# Patient Record
Sex: Male | Born: 1950 | Race: White | Hispanic: No | Marital: Married | Smoking: Never smoker
Health system: Southern US, Community
[De-identification: ages and names within clinical notes are randomized; demographics above are authoritative.]

## PROBLEM LIST (undated history)

## (undated) DIAGNOSIS — Z8619 Personal history of other infectious and parasitic diseases: Secondary | ICD-10-CM

## (undated) DIAGNOSIS — Z86010 Personal history of colonic polyps: Secondary | ICD-10-CM

## (undated) HISTORY — PX: COLONOSCOPY: SHX174

## (undated) HISTORY — DX: Personal history of other infectious and parasitic diseases: Z86.19

## (undated) HISTORY — DX: Personal history of colonic polyps: Z86.010

---

## 1972-10-27 HISTORY — PX: APPENDECTOMY: SHX54

## 2003-02-16 DIAGNOSIS — Z86010 Personal history of colonic polyps: Secondary | ICD-10-CM

## 2003-02-16 DIAGNOSIS — Z860101 Personal history of adenomatous and serrated colon polyps: Secondary | ICD-10-CM

## 2003-02-16 HISTORY — DX: Personal history of adenomatous and serrated colon polyps: Z86.0101

## 2003-02-16 HISTORY — DX: Personal history of colonic polyps: Z86.010

## 2003-02-16 LAB — HM COLONOSCOPY

## 2013-12-19 LAB — LIPID PANEL
Cholesterol: 190 mg/dL (ref 0–200)
HDL: 63 mg/dL (ref 35–70)
LDL Cholesterol: 113 mg/dL
Triglycerides: 69 mg/dL (ref 40–160)

## 2013-12-19 LAB — BASIC METABOLIC PANEL
BUN: 12 mg/dL (ref 4–21)
CREATININE: 0.8 mg/dL (ref 0.6–1.3)
Glucose: 84 mg/dL
Potassium: 4.7 mmol/L (ref 3.4–5.3)
Sodium: 142 mmol/L (ref 137–147)

## 2013-12-19 LAB — HEPATIC FUNCTION PANEL
ALT: 18 U/L (ref 10–40)
AST: 18 U/L (ref 14–40)

## 2013-12-19 LAB — PSA: PSA: 3.1

## 2015-01-08 LAB — PSA: PSA: 3.1

## 2015-01-08 LAB — LIPID PANEL
CHOLESTEROL: 170 mg/dL (ref 0–200)
HDL: 63 mg/dL (ref 35–70)
LDL CALC: 96 mg/dL
TRIGLYCERIDES: 57 mg/dL (ref 40–160)

## 2015-01-08 LAB — BASIC METABOLIC PANEL
BUN: 10 mg/dL (ref 4–21)
CREATININE: 0.7 mg/dL (ref 0.6–1.3)
GLUCOSE: 85 mg/dL
POTASSIUM: 4.6 mmol/L (ref 3.4–5.3)
Sodium: 140 mmol/L (ref 137–147)

## 2015-01-08 LAB — HEPATIC FUNCTION PANEL
ALT: 16 U/L (ref 10–40)
AST: 17 U/L (ref 14–40)

## 2015-05-08 ENCOUNTER — Encounter: Payer: Self-pay | Admitting: *Deleted

## 2015-05-08 ENCOUNTER — Ambulatory Visit (INDEPENDENT_AMBULATORY_CARE_PROVIDER_SITE_OTHER): Payer: Self-pay | Admitting: Family Medicine

## 2015-05-08 ENCOUNTER — Encounter: Payer: Self-pay | Admitting: Family Medicine

## 2015-05-08 VITALS — BP 114/80 | HR 71 | Temp 97.8°F | Resp 16 | Ht 72.5 in | Wt 203.0 lb

## 2015-05-08 DIAGNOSIS — K4090 Unilateral inguinal hernia, without obstruction or gangrene, not specified as recurrent: Secondary | ICD-10-CM | POA: Insufficient documentation

## 2015-05-08 DIAGNOSIS — Z86010 Personal history of colonic polyps: Secondary | ICD-10-CM

## 2015-05-08 NOTE — Progress Notes (Signed)
       Patient: Norman Wilson Male    DO: 02/28/1951   64 y.o.   MRM: [id number] Visit Date: 05/08/2015  Today's Provider: Mila Merryonald Uliana Brinker, MD   Chief Complaint  Patient presents with  . Groin Pain   Subjective:    Groin Pain The patient's pertinent negatives include no pelvic pain. This is a recurrent problem. The problem occurs intermittently. The problem has been waxing and waning. The pain is severe. Associated symptoms include joint pain. Pertinent negatives include no abdominal pain, chest pain, flank pain or headaches. The symptoms are aggravated by heavy lifting and activity. He has tried OTC analgesics for the symptoms. The treatment provided no relief.   He was initially seen for this in October, 2015, about 2 weeks after he was working and felt a pop in groin area when twisting and lifting heavy boxes. At that time exam did not reveal any apparent hernia, and it was though that he strain a torn a muscle in the area. However, he states that seen that pain has become increasing frequent, associated with lifting and being on his feet for long periods of time, and has been having significant swelling in left inguinal area, when he is able to push back in. After pushing swollen area, he states it will usually come back out after a few hours of being on his feet. He not any specific new injuries since he was initially evaluated in October.      No Known Allergies Previous Medications   ASPIRIN 81 MG TABLET    Take 81 mg by mouth daily.   NAPROXEN SODIUM (ANAPROX) 220 MG TABLET    Take 220 mg by mouth 2 (two) times daily with a meal.    Review of Systems  Cardiovascular: Negative for chest pain.  Gastrointestinal: Negative for abdominal pain.  Genitourinary: Negative for flank pain and pelvic pain.  Musculoskeletal: Positive for joint pain.  Neurological: Negative for headaches.    History  Substance Use Topics  . Smoking status: Never Smoker   . Smokeless tobacco: Not on  file  . Alcohol Use: No   Objective:   BP 114/80 mmHg  Pulse 71  Temp(Src) 97.8 F (36.6 C) (Oral)  Resp 16  Ht 6' 0.5" (1.842 m)  Wt 203 lb (92.08 kg)  BMI 27.14 kg/m2  SpO2 96%  Physical Exam  General Appearance:    Alert, cooperative, no distress  Eyes:    PERRL, conjunctiva/corneas clear, EOM's intact       Lungs:     Clear to auscultation bilaterally, respirations unlabored  Heart:    Regular rate and rhythm  Neurologic:   Awake, alert, oriented x 3. No apparent focal neurological           defect.   GU:   Mild swelling and tenderness left inguinal area, although, by patient report, the swelling often becomes much more prominent than it is during his exam today.         Assessment & Plan:      1. Unilateral inguinal hernia without obstruction or gangrene, recurrence not specified (Suspected) Patient reported history is very suspicious for inguinal hernia with progressive symptoms. Refer surgery for further evaluation. Consider MRI if diagnosis uncertain.        Mila Merryonald Zubin Pontillo, MD  [facility]

## 2015-05-23 ENCOUNTER — Ambulatory Visit (INDEPENDENT_AMBULATORY_CARE_PROVIDER_SITE_OTHER): Payer: Self-pay | Admitting: Surgery

## 2015-05-23 ENCOUNTER — Encounter: Payer: Self-pay | Admitting: Surgery

## 2015-05-23 ENCOUNTER — Encounter (INDEPENDENT_AMBULATORY_CARE_PROVIDER_SITE_OTHER): Payer: Self-pay

## 2015-05-23 VITALS — BP 135/74 | HR 75 | Temp 98.1°F | Ht 74.0 in | Wt 204.0 lb

## 2015-05-23 DIAGNOSIS — K4090 Unilateral inguinal hernia, without obstruction or gangrene, not specified as recurrent: Secondary | ICD-10-CM

## 2015-05-23 NOTE — Progress Notes (Signed)
  Surgical Consultation  05/23/2015  Norman Wilson is an 64 y.o. male. Referred by his primary care physician for a left inguinal hernia.  Chief Complaint  Patient presents with  . Inguinal Hernia     HPI: He is self-employed doing significant heavy work and has noticed some discomfort and a bulge in his left groin area. He saw his primary care physician over a year ago identified a left inguinal hernia and recommended surgical intervention or evaluation. The patient is having increased symptoms at the present time. He has no GI or GU problems but often must lay down and reduce the hernia to avoid continued discomfort. He has decided to pursue surgical intervention. His only other GI surgery was an appendectomy.  Past Medical History  Diagnosis Date  . H/O adenomatous polyp of colon 02/16/2003  . History of chicken pox     Past Surgical History  Procedure Laterality Date  . Appendectomy  1974    Family History  Problem Relation Age of Onset  . Alcohol abuse Father     Social History:  reports that he has never smoked. He has never used smokeless tobacco. He reports that he does not drink alcohol or use illicit drugs.  Allergies: No Known Allergies  Medications reviewed.     Review of Systems  Constitutional: Negative for fever, chills and weight loss.  HENT: Negative.   Eyes: Negative.   Respiratory: Negative for cough and shortness of breath.   Cardiovascular: Negative for chest pain and palpitations.  Gastrointestinal: Positive for abdominal pain. Negative for heartburn, nausea and vomiting.  Musculoskeletal: Negative.   Skin: Negative.   Neurological: Negative.   Psychiatric/Behavioral: Negative.        BP 135/74 mmHg  Pulse 75  Temp(Src) 98.1 F (36.7 C) (Oral)  Ht  (1.88 m)  Wt 204 lb (92.534 kg)  BMI 26.18 kg/m2  Physical Exam  Constitutional: He is oriented to person, place, and time and well-developed, well-nourished, and in no distress.   HENT:  Head: Normocephalic and atraumatic.  Eyes: Conjunctivae are normal. Pupils are equal, round, and reactive to light.  Neck: Normal range of motion. Neck supple.  Cardiovascular: Regular rhythm and normal heart sounds.   Pulmonary/Chest: Effort normal and breath sounds normal.  Abdominal: Soft. Bowel sounds are normal.  He has a small left inguinal hernia which appears to be easily reducible. It does not involve his scrotum.  Musculoskeletal: Normal range of motion. He exhibits no edema.  Neurological: He is alert and oriented to person, place, and time.  Skin: Skin is warm and dry.  Psychiatric: Mood and affect normal.      No results found for this or any previous visit (from the past 48 hour(s)). No results found.  Assessment/Plan: 1. Unilateral inguinal hernia without obstruction or gangrene, recurrence not specified He has a left inguinal hernia and would like to consider surgical intervention. Surgical options were outlined to him in detail. We have decided upon a robotic-assisted laparoscopic left inguinal hernia repair. Risks benefits and options been outlined to the patient in detail as as well as the requirements for recovery. Risk of recurrence was discussed.   Tiney Rouge III dermatitis

## 2015-05-24 ENCOUNTER — Telehealth: Payer: Self-pay | Admitting: Surgery

## 2015-05-24 NOTE — Telephone Encounter (Signed)
Pt advised of pre op date/time and sx date. Sx: 07/09/15 with Dr Michela Pitcher for Robot assisted lap left inguinal hernia repair. Pre op: Phone: 07/03/15 between 9-1pm.  Pt is self pay. Physician estimate is [phone].

## 2015-07-03 ENCOUNTER — Other Ambulatory Visit: Payer: Self-pay

## 2015-07-06 ENCOUNTER — Encounter: Payer: Self-pay | Admitting: *Deleted

## 2015-07-06 NOTE — Patient Instructions (Signed)
  Your procedure is scheduled on: 07-09-15 Report to [facility] 2ND FLOOR To find out your arrival time please call [phone] between 1PM - 3PM on 07-06-15  Remember: Instructions that are not followed completely may result in serious medical risk, up to and including death, or upon the discretion of your surgeon and anesthesiologist your surgery may need to be rescheduled.    _X___ 1. Do not eat food or drink liquids after midnight. No gum chewing or hard candies.     _X___ 2. No Alcohol for 24 hours before or after surgery.   ____ 3. Bring all medications with you on the day of surgery if instructed.    ____ 4. Notify your doctor if there is any change in your medical condition     (cold, fever, infections).     Do not wear jewelry, make-up, hairpins, clips or nail polish.  Do not wear lotions, powders, or perfumes. You may wear deodorant.  Do not shave 48 hours prior to surgery. Men may shave face and neck.  Do not bring valuables to the hospital.    [facility] is not responsible for any belongings or valuables.               Contacts, dentures or bridgework may not be worn into surgery.  Leave your suitcase in the car. After surgery it may be brought to your room.  For patients admitted to the hospital, discharge time is determined by your  treatment team.   Patients discharged the day of surgery will not be allowed to drive home.   Please read over the following fact sheets that you were given:     ____ Take these medicines the morning of surgery with A SIP OF WATER:    1. NONE  2.   3.   4.  5.  6.  ____ Fleet Enema (as directed)   ____ Use CHG Soap as directed  ____ Use inhalers on the day of surgery  ____ Stop metformin 2 days prior to surgery    ____ Take 1/2 of usual insulin dose the night before surgery and none on the morning of surgery.   _X___ Stop Coumadin/Plavix/aspirin-STOP ASA NOW  _X___ Stop Anti-inflammatories-STOP ALEVE  NOW-NO NSAIDS OR ASA PRODUCTS-TYLENOL OK   ____ Stop supplements until after surgery.    ____ Bring C-Pap to the hospital.

## 2015-07-09 ENCOUNTER — Ambulatory Visit: Payer: Medicare Other | Admitting: Anesthesiology

## 2015-07-09 ENCOUNTER — Encounter: Payer: Self-pay | Admitting: *Deleted

## 2015-07-09 ENCOUNTER — Ambulatory Visit
Admission: RE | Admit: 2015-07-09 | Discharge: 2015-07-09 | Disposition: A | Discharge status: Home / Self Care | Payer: Medicare Other | Source: Ambulatory Visit | Attending: Surgery | Admitting: Surgery

## 2015-07-09 ENCOUNTER — Encounter
Admission: RE | Disposition: A | Discharge status: Home / Self Care | Payer: Self-pay | Source: Ambulatory Visit | Attending: Surgery

## 2015-07-09 DIAGNOSIS — Z7982 Long term (current) use of aspirin: Secondary | ICD-10-CM | POA: Insufficient documentation

## 2015-07-09 DIAGNOSIS — Z91040 Latex allergy status: Secondary | ICD-10-CM | POA: Insufficient documentation

## 2015-07-09 DIAGNOSIS — K4090 Unilateral inguinal hernia, without obstruction or gangrene, not specified as recurrent: Secondary | ICD-10-CM | POA: Diagnosis not present

## 2015-07-09 DIAGNOSIS — Z86010 Personal history of colonic polyps: Secondary | ICD-10-CM | POA: Diagnosis not present

## 2015-07-09 DIAGNOSIS — K469 Unspecified abdominal hernia without obstruction or gangrene: Secondary | ICD-10-CM | POA: Insufficient documentation

## 2015-07-09 HISTORY — PX: ROBOT ASSISTED INGUINAL HERNIA REPAIR: SHX6561

## 2015-07-09 LAB — BASIC METABOLIC PANEL
ANION GAP: 7 (ref 5–15)
BUN: 12 mg/dL (ref 6–20)
CHLORIDE: 105 mmol/L (ref 101–111)
CO2: 29 mmol/L (ref 22–32)
Calcium: 9.3 mg/dL (ref 8.9–10.3)
Creatinine, Ser: 0.77 mg/dL (ref 0.61–1.24)
GFR calc Af Amer: >60 mL/min (ref >60)
GLUCOSE: 103 mg/dL — AB (ref 65–99)
POTASSIUM: 4.7 mmol/L (ref 3.5–5.1)
SODIUM: 141 mmol/L (ref 135–145)

## 2015-07-09 LAB — CBC WITH DIFFERENTIAL/PLATELET
BASOS ABS: 0.1 10*3/uL (ref 0–0.1)
Basophils Relative: 1 %
EOS PCT: 3 %
Eosinophils Absolute: 0.3 10*3/uL (ref 0–0.7)
HEMATOCRIT: 44.9 % (ref 40.0–52.0)
Hemoglobin: 14.9 g/dL (ref 13.0–18.0)
LYMPHS ABS: 1.6 10*3/uL (ref 1.0–3.6)
LYMPHS PCT: 19 %
MCH: 31.8 pg (ref 26.0–34.0)
MCHC: 33.2 g/dL (ref 32.0–36.0)
MCV: 95.7 fL (ref 80.0–100.0)
Monocytes Absolute: 0.6 10*3/uL (ref 0.2–1.0)
Monocytes Relative: 7 %
NEUTROS ABS: 6 10*3/uL (ref 1.4–6.5)
Neutrophils Relative %: 70 %
Platelets: 262 10*3/uL (ref 150–440)
RBC: 4.7 MIL/uL (ref 4.40–5.90)
RDW: 14.6 % — ABNORMAL HIGH (ref 11.5–14.5)
WBC: 8.5 10*3/uL (ref 3.8–10.6)

## 2015-07-09 SURGERY — ROBOT ASSISTED INGUINAL HERNIA REPAIR
Anesthesia: General | Laterality: Left | Wound class: Clean

## 2015-07-09 MED ORDER — DEXAMETHASONE SODIUM PHOSPHATE 4 MG/ML IJ SOLN
INTRAMUSCULAR | Status: DC | PRN
  Administered 2015-07-09: 10 mg via INTRAVENOUS

## 2015-07-09 MED ORDER — HYDROCODONE-ACETAMINOPHEN 5-325 MG PO TABS: 1.0000 | ORAL_TABLET | Freq: Four times a day (QID) | ORAL | Status: DC | PRN

## 2015-07-09 MED ORDER — FENTANYL CITRATE (PF) 100 MCG/2ML IJ SOLN
INTRAMUSCULAR | Status: DC | PRN
  Administered 2015-07-09 (×2): 50 ug via INTRAVENOUS

## 2015-07-09 MED ORDER — ONDANSETRON HCL 4 MG/2ML IJ SOLN
INTRAMUSCULAR | Status: DC | PRN
  Administered 2015-07-09: 4 mg via INTRAVENOUS

## 2015-07-09 MED ORDER — ROCURONIUM BROMIDE 100 MG/10ML IV SOLN
INTRAVENOUS | Status: DC | PRN
  Administered 2015-07-09: 10 mg via INTRAVENOUS
  Administered 2015-07-09: 20 mg via INTRAVENOUS
  Administered 2015-07-09: 30 mg via INTRAVENOUS

## 2015-07-09 MED ORDER — CHLORHEXIDINE GLUCONATE 4 % EX LIQD: 1.0000 "application " | Freq: Once | CUTANEOUS | Status: DC

## 2015-07-09 MED ORDER — SUCCINYLCHOLINE CHLORIDE 20 MG/ML IJ SOLN
INTRAMUSCULAR | Status: DC | PRN
  Administered 2015-07-09: 80 mg via INTRAVENOUS

## 2015-07-09 MED ORDER — PROPOFOL 10 MG/ML IV BOLUS
INTRAVENOUS | Status: DC | PRN
  Administered 2015-07-09: 150 mg via INTRAVENOUS

## 2015-07-09 MED ORDER — BUPIVACAINE HCL (PF) 0.25 % IJ SOLN
INTRAMUSCULAR | Status: DC | PRN
  Administered 2015-07-09: 30 mL

## 2015-07-09 MED ORDER — ONDANSETRON HCL 4 MG/2ML IJ SOLN: 4.0000 mg | Freq: Once | INTRAMUSCULAR | Status: DC | PRN

## 2015-07-09 MED ORDER — HYDROMORPHONE HCL 1 MG/ML IJ SOLN
INTRAMUSCULAR | Status: DC | PRN
  Administered 2015-07-09 (×2): .6 mg via INTRAVENOUS

## 2015-07-09 MED ORDER — GLYCOPYRROLATE 0.2 MG/ML IJ SOLN
INTRAMUSCULAR | Status: DC | PRN
  Administered 2015-07-09: .3 mg via INTRAVENOUS

## 2015-07-09 MED ORDER — DEXMEDETOMIDINE HCL 200 MCG/2ML IV SOLN
INTRAVENOUS | Status: DC | PRN
  Administered 2015-07-09: 16 ug via INTRAVENOUS

## 2015-07-09 MED ORDER — LIDOCAINE HCL (CARDIAC) 20 MG/ML IV SOLN
INTRAVENOUS | Status: DC | PRN
  Administered 2015-07-09: 100 mg via INTRAVENOUS

## 2015-07-09 MED ORDER — FAMOTIDINE 20 MG PO TABS
ORAL_TABLET | ORAL | Status: AC
  Filled 2015-07-09: qty 1

## 2015-07-09 MED ORDER — BUPIVACAINE HCL (PF) 0.25 % IJ SOLN
INTRAMUSCULAR | Status: AC
  Filled 2015-07-09: qty 30

## 2015-07-09 MED ORDER — CEFAZOLIN SODIUM-DEXTROSE 2-3 GM-% IV SOLR: 2.0000 g | INTRAVENOUS | Status: DC

## 2015-07-09 MED ORDER — GLYCOPYRROLATE 0.2 MG/ML IJ SOLN: INTRAMUSCULAR | Status: DC | PRN

## 2015-07-09 MED ORDER — ATROPINE SULFATE 0.4 MG/ML IJ SOLN
INTRAMUSCULAR | Status: DC | PRN
  Administered 2015-07-09: 0.2 mg via INTRAVENOUS

## 2015-07-09 MED ORDER — LACTATED RINGERS IV SOLN
INTRAVENOUS | Status: DC
  Administered 2015-07-09 (×2): via INTRAVENOUS

## 2015-07-09 MED ORDER — ENOXAPARIN SODIUM 40 MG/0.4ML ~~LOC~~ SOLN
40.0000 mg | Freq: Once | SUBCUTANEOUS | Status: AC
  Administered 2015-07-09: 40 mg via SUBCUTANEOUS
  Filled 2015-07-09: qty 0.4

## 2015-07-09 MED ORDER — EPHEDRINE SULFATE 50 MG/ML IJ SOLN
INTRAMUSCULAR | Status: DC | PRN
  Administered 2015-07-09 (×2): 10 mg via INTRAVENOUS

## 2015-07-09 MED ORDER — CEFAZOLIN SODIUM-DEXTROSE 2-3 GM-% IV SOLR
INTRAVENOUS | Status: AC
  Administered 2015-07-09: 2 g via INTRAVENOUS
  Filled 2015-07-09: qty 50

## 2015-07-09 MED ORDER — MIDAZOLAM HCL 2 MG/2ML IJ SOLN
INTRAMUSCULAR | Status: DC | PRN
  Administered 2015-07-09: 2 mg via INTRAVENOUS

## 2015-07-09 MED ORDER — HEPARIN SODIUM (PORCINE) 5000 UNIT/ML IJ SOLN
INTRAMUSCULAR | Status: AC
  Filled 2015-07-09: qty 1

## 2015-07-09 MED ORDER — FENTANYL CITRATE (PF) 100 MCG/2ML IJ SOLN
25.0000 ug | INTRAMUSCULAR | Status: DC | PRN
  Administered 2015-07-09 (×4): 25 ug via INTRAVENOUS

## 2015-07-09 MED ORDER — SUGAMMADEX SODIUM 200 MG/2ML IV SOLN
INTRAVENOUS | Status: DC | PRN
  Administered 2015-07-09: 372 mg via INTRAVENOUS

## 2015-07-09 MED ORDER — FENTANYL CITRATE (PF) 100 MCG/2ML IJ SOLN
INTRAMUSCULAR | Status: AC
  Administered 2015-07-09: 25 ug via INTRAVENOUS
  Filled 2015-07-09: qty 2

## 2015-07-09 MED ORDER — FAMOTIDINE 20 MG PO TABS: 20.0000 mg | ORAL_TABLET | Freq: Once | ORAL | Status: DC

## 2015-07-09 SURGICAL SUPPLY — 49 items
CANISTER SUCT 1200ML W/VALVE (MISCELLANEOUS) ×3 IMPLANT
CANNULA SEALS 8.5MM (CANNULA) ×2
CATH TRAY 16F METER LATEX (MISCELLANEOUS) IMPLANT
CHLORAPREP W/TINT 26ML (MISCELLANEOUS) ×3 IMPLANT
CORD BIP STRL DISP 12FT (MISCELLANEOUS) IMPLANT
COVER TIP SHEARS 8 DVNC (MISCELLANEOUS) ×1 IMPLANT
COVER TIP SHEARS 8MM DA VINCI (MISCELLANEOUS) ×2
DEFOGGER SCOPE WARMER CLEARIFY (MISCELLANEOUS) ×3 IMPLANT
DRAPE 3 ARM ACCESS DA VINCI (DRAPES) ×2
DRAPE 3 ARM ACCESS DVNC (DRAPES) ×1 IMPLANT
DRAPE SHEET LG 3/4 BI-LAMINATE (DRAPES) ×3 IMPLANT
DRESSING TELFA 4X3 1S ST N-ADH (GAUZE/BANDAGES/DRESSINGS) ×3 IMPLANT
DRIVER NDL 23- D MEGA 49.7X1.4 (INSTRUMENTS) ×1 IMPLANT
DRSG TEGADERM 2-3/8X2-3/4 SM (GAUZE/BANDAGES/DRESSINGS) ×9 IMPLANT
DRVR NDL 23- D MEGA 49.7X1.4 (INSTRUMENTS) ×1
GLOVE BIO SURGEON STRL SZ7.5 (GLOVE) ×6 IMPLANT
GLOVE INDICATOR 8.0 STRL GRN (GLOVE) ×6 IMPLANT
GOWN STRL REUS W/ TWL LRG LVL3 (GOWN DISPOSABLE) ×4 IMPLANT
GOWN STRL REUS W/TWL LRG LVL3 (GOWN DISPOSABLE) ×8
IRRIGATION STRYKERFLOW (MISCELLANEOUS) IMPLANT
IRRIGATOR STRYKERFLOW (MISCELLANEOUS)
IV NS 1000ML (IV SOLUTION)
IV NS 1000ML BAXH (IV SOLUTION) IMPLANT
KIT PINK PAD W/HEAD ARE REST (MISCELLANEOUS) ×3
KIT PINK PAD W/HEAD ARM REST (MISCELLANEOUS) ×1 IMPLANT
LABEL OR SOLS (LABEL) IMPLANT
MESH 3DMAX 3X5 LT MED (Mesh General) ×3 IMPLANT
NEEDLE FILTER BLUNT 18X 1/2SAF (NEEDLE)
NEEDLE FILTER BLUNT 18X1 1/2 (NEEDLE) IMPLANT
NEEDLE HYPO 25X1 1.5 SAFETY (NEEDLE) ×3 IMPLANT
NEEDLE INSUFFLATION 14GA 120MM (NEEDLE) IMPLANT
PACK LAP CHOLECYSTECTOMY (MISCELLANEOUS) ×3 IMPLANT
PAD GROUND ADULT SPLIT (MISCELLANEOUS) ×3 IMPLANT
SCISSORS METZENBAUM CVD 33 (INSTRUMENTS) IMPLANT
SEAL CANN 8.5 DVNC (CANNULA) ×1 IMPLANT
SLEEVE ADV FIXATION 5X100MM (TROCAR) ×3 IMPLANT
SOLUTION ELECTROLUBE (MISCELLANEOUS) ×3 IMPLANT
STRAP SAFETY BODY (MISCELLANEOUS) ×3 IMPLANT
SUT CUT NEEDLE DRIVER (INSTRUMENTS) ×2
SUT ETHILON 5-0 FS-2 18 BLK (SUTURE) ×3 IMPLANT
SUT QUILL 0 20X36 (SUTURE) ×6 IMPLANT
SUT VIC AB 0 CT2 27 (SUTURE) ×3 IMPLANT
SUT VICRYL 0 27 CT2 27 ABS (SUTURE) IMPLANT
SUT VICRYL 3-0 27IN (SUTURE) IMPLANT
SYR 3ML LL SCALE MARK (SYRINGE) IMPLANT
TROCAR VISIPORT PLUS 176673P (TROCAR) ×3 IMPLANT
TROCAR Z-THREAD FIOS 11X100 BL (TROCAR) ×3 IMPLANT
TROCAR Z-THREAD OPTICAL 5X100M (TROCAR) ×3 IMPLANT
TUBING INSUFFLATOR HI FLOW (MISCELLANEOUS) ×3 IMPLANT

## 2015-07-09 NOTE — Anesthesia Procedure Notes (Signed)
Procedure Name: Intubation Date/Time: 07/09/2015 7:38 AM Performed by: Almeta Monas Pre-anesthesia Checklist: Patient identified, Emergency Drugs available, Suction available, Patient being monitored and Timeout performed Patient Re-evaluated:Patient Re-evaluated prior to inductionOxygen Delivery Method: Circle system utilized Preoxygenation: Pre-oxygenation with 100% oxygen Intubation Type: IV induction Ventilation: Mask ventilation without difficulty Laryngoscope Size: Miller and 2 Grade View: Grade I Tube type: Oral Tube size: 7.0 mm Number of attempts: 4 Airway Equipment and Method: Stylet Placement Confirmation: positive ETCO2,  CO2 detector and breath sounds checked- equal and bilateral Secured at: 21 cm Tube secured with: Tape Dental Injury: Bloody posterior oropharynx  Difficulty Due To: Difficulty was unanticipated and Difficult Airway- due to anterior larynx Future Recommendations: Recommend- induction with short-acting agent, and alternative techniques readily available Comments: Anterior appearing airway, unable to see vocal cords with a Mac 3, switched to miller 2, difficult to lift epiglottis, attempted with McGrath good view but unable to pass ETT, good view with glidescope but still unable to pass bougie or ETT, Dr. Noralyn Pick was able to lift epiglottis with miller 2, bougie. Blood noted in posterior portion of airway, decasron given

## 2015-07-09 NOTE — Op Note (Signed)
07/09/2015  9:59 AM  PATIENT:  Norman Wilson  64 y.o. male  PRE-OPERATIVE DIAGNOSIS:  UNILATERAL INGUINAL HERNIA  POST-OPERATIVE DIAGNOSIS:  Left inguinal hernia  PROCEDURE:  Procedure(s): ROBOT ASSISTED INGUINAL HERNIA REPAIR (Left)  SURGEON:  Surgeon(s) and Role:    * Tiney Rouge III, MD - Primary   ASSISTANTS: none   ANESTHESIA:   general  EBL:      DRAINS: none   LOCAL MEDICATIONS USED:  BUPIVICAINE    DISPOSITION OF SPECIMEN:  N/A   DICTATION: .Dragon Dictation with the patient supine position and after induction of appropriate general anesthesia the patient's abdomen was prepped with ChloraPrep and draped sterile towels. The patient's place headdown feet up position. A small left upper quadrant transverse incision was made and a 11 mm port placed with the Optiview technique. CO2 was insufflated. A midline incision was made transversely and made robotic camera port inserted under direct vision. 2 lateral ports were utilized under direct vision. The patient placed in steep Trendelenburg robot brought to the table and docked to the patient. The instruments were inserted under direct vision. I then moved to the console.   There appeared to be a fairly large indirect defect and possibly a small direct defect. The peritoneum was taken down using combination of blunt and sharp dissection from the medial umbilical fold across epigastric vessels. The peritoneum was dissected free from the indirect defect and the cord structures without difficulty and was reduced into the preperitoneal space. Small direct defect was identified in addition. The indirect defect appeared to have some mobility to the fascial floor so the inguinal floor was then closed using a running suture of the lock suture. A piece of Bard number 3D Max mesh was brought to the table. A medium size was utilized. It was placed into the peritoneal cavity through the assistance port and directed into the preperitoneal space.  It was then sutured in place using 0 Vicryl under direct vision. The peritoneum was then placed back over the mesh separating it from the bowel contents using locking suture. The sac was tacked to the anterior abdominal wall with a locking suture.  The repair appeared be satisfactory. History was were removed under direct vision. The robot was undocked removed away from the table. Ports withdrawn without difficulty. Skin incisions were closed with 5-0 nylon. The area was infiltrated with 0.25% Marcaine for postoperative pain control. Sterile dressings were applied. The patient was returned recovery room having tolerated procedure well. Sponge instrument needle count were correct 2 in the operating room.  PLAN OF CARE: Discharge to home after PACU  PATIENT DISPOSITION:  PACU - hemodynamically stable.   Tiney Rouge III, MD

## 2015-07-09 NOTE — Anesthesia Postprocedure Evaluation (Signed)
  Anesthesia Post-op Note  Patient: Norman Wilson  Procedure(s) Performed: Procedure(s): ROBOT ASSISTED INGUINAL HERNIA REPAIR (Left)  Anesthesia type:General  Patient location: PACU  Post pain: Pain level controlled  Post assessment: Post-op Vital signs reviewed, Patient's Cardiovascular Status Stable, Respiratory Function Stable, Patent Airway and No signs of Nausea or vomiting  Post vital signs: Reviewed and stable  Last Vitals:  Filed Vitals:   07/09/15 1200  BP: 132/76  Pulse:   Temp: 36.3 C  Resp: 18    Level of consciousness: awake, alert  and patient cooperative  Complications: No apparent anesthesia complications

## 2015-07-09 NOTE — H&P (Signed)
Norman Wilson is a 64 y.o. male  with a symptomatic left inguinal hernia.  HPI: He has had increasing problems with a left inguinal hernia. 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 It is always been reducible. He said no GI or GU symptoms associated with it. He is admitted for elective repair.  Past Medical History  Diagnosis Date  . H/O adenomatous polyp of colon 02/16/2003  . History of chicken pox    Past Surgical History  Procedure Laterality Date  . Appendectomy  1974  . Colonoscopy     Social History   Social History  . Marital Status: Married    Spouse Name: N/A  . Number of Children: N/A  . Years of Education: Some Coll   Occupational History  . Full-Time    Social History Main Topics  . Smoking status: Never Smoker   . Smokeless tobacco: Never Used  . Alcohol Use: No  . Drug Use: No  . Sexual Activity: Not Asked   Other Topics Concern  . None   Social History Narrative     Review of Systems  Constitutional: Negative.   HENT: Negative.   Eyes: Negative.   Respiratory: Negative.   Cardiovascular: Negative.   Gastrointestinal: Negative for heartburn, nausea and vomiting.  Genitourinary: Negative.   Musculoskeletal: Negative.   Skin: Negative.   Neurological: Negative.   Psychiatric/Behavioral: Negative.      PHYSICAL EXAM: BP 120/73 mmHg  Pulse 65  Temp(Src) 97.7 F (36.5 C) (Oral)  Resp 16  Ht  (1.905 m)  Wt 205 lb (92.987 kg)  BMI 25.62 kg/m2  SpO2 99%  Physical Exam  Constitutional: He is oriented to person, place, and time. He appears well-developed and well-nourished.  HENT:  Head: Normocephalic and atraumatic.  Eyes: EOM are normal. Pupils are equal, round, and reactive to light.  Neck: Normal range of motion. Neck supple.  Cardiovascular: Regular rhythm and normal heart sounds.   Pulmonary/Chest: Effort normal and breath sounds normal.  Abdominal: Soft. Bowel sounds are normal.   Palpable left inguinal hernia which is reducible  Musculoskeletal: Normal range of motion. He exhibits no edema.  Neurological: He is alert and oriented to person, place, and time.  Skin: Skin is warm and dry.  Psychiatric: His behavior is normal. Judgment normal.   He has a palpable left inguinal hernia which is reducible. I do not feel a defect on the right side.  Impression/Plan: We plan elective left inguinal hernia repair using the robotic-assisted laparoscopic approach. Risks benefits and options been outlined and accepted. The possibility of conversion to an open repair has been accepted.   Norman Rouge III, MD  07/09/2015, 7:05 AM

## 2015-07-09 NOTE — Transfer of Care (Signed)
Immediate Anesthesia Transfer of Care Note  Patient: Norman Wilson  Procedure(s) Performed: Procedure(s): ROBOT ASSISTED INGUINAL HERNIA REPAIR (Left)  Patient Location: PACU  Anesthesia Type:General  Level of Consciousness: oriented  Airway & Oxygen Therapy: Patient Spontanous Breathing  Post-op Assessment: Report given to RN and Post -op Vital signs reviewed and stable  Post vital signs: Reviewed and stable  Last Vitals:  Filed Vitals:   07/09/15 0618  BP: 120/73  Pulse: 65  Temp: 36.5 C  Resp: 16    Complications: No apparent anesthesia complications

## 2015-07-09 NOTE — Discharge Instructions (Signed)

## 2015-07-09 NOTE — Anesthesia Preprocedure Evaluation (Signed)
Anesthesia Evaluation  Patient identified by MRN, date of birth, ID band Patient awake    Reviewed: Allergy & Precautions, NPO status , Patient's Chart, lab work & pertinent test results, reviewed documented beta blocker date and time   Airway Mallampati: II  TM Distance: >3 FB     Dental  (+) Chipped   Pulmonary           Cardiovascular      Neuro/Psych    GI/Hepatic   Endo/Other    Renal/GU      Musculoskeletal   Abdominal   Peds  Hematology   Anesthesia Other Findings   Reproductive/Obstetrics                             Anesthesia Physical Anesthesia Plan  ASA: II  Anesthesia Plan: General   Post-op Pain Management:    Induction: Intravenous  Airway Management Planned: Oral ETT  Additional Equipment:   Intra-op Plan:   Post-operative Plan:   Informed Consent: I have reviewed the patients History and Physical, chart, labs and discussed the procedure including the risks, benefits and alternatives for the proposed anesthesia with the patient or authorized representative who has indicated his/her understanding and acceptance.     Plan Discussed with: CRNA  Anesthesia Plan Comments:         Anesthesia Quick Evaluation  

## 2015-07-12 DIAGNOSIS — S86919A Strain of unspecified muscle(s) and tendon(s) at lower leg level, unspecified leg, initial encounter: Secondary | ICD-10-CM | POA: Insufficient documentation

## 2015-07-12 DIAGNOSIS — Z8619 Personal history of other infectious and parasitic diseases: Secondary | ICD-10-CM | POA: Insufficient documentation

## 2015-07-18 ENCOUNTER — Encounter: Payer: Self-pay | Admitting: Surgery

## 2015-07-18 ENCOUNTER — Encounter (INDEPENDENT_AMBULATORY_CARE_PROVIDER_SITE_OTHER): Payer: Self-pay

## 2015-07-18 ENCOUNTER — Ambulatory Visit (INDEPENDENT_AMBULATORY_CARE_PROVIDER_SITE_OTHER): Payer: Medicare Other | Admitting: Surgery

## 2015-07-18 VITALS — BP 162/92 | HR 73 | Temp 98.2°F | Ht 75.0 in | Wt 208.8 lb

## 2015-07-18 DIAGNOSIS — K4090 Unilateral inguinal hernia, without obstruction or gangrene, not specified as recurrent: Secondary | ICD-10-CM

## 2015-07-18 NOTE — Patient Instructions (Signed)
Please call our office with any questions or concerns. 

## 2015-07-18 NOTE — Progress Notes (Signed)
Outpatient Surgical Follow Up  07/18/2015  Norman Wilson is an 64 y.o. male.   Chief Complaint  Patient presents with  . Routine Post Op    Robot assisted Left Inguinal Hernia Repair (07/09/15)- Dr. Michela Pitcher    HPI: He returns for follow-up after his robot-assisted left inguinal hernia repair. He's doing very well with no complaints at the present time. Eating well with good bowel function is noted GU concerns.  Past Medical History  Diagnosis Date  . H/O adenomatous polyp of colon 02/16/2003  . History of chicken pox     Past Surgical History  Procedure Laterality Date  . Appendectomy  1974  . Colonoscopy    . Robot assisted inguinal hernia repair Left 07/09/2015    Procedure: ROBOT ASSISTED INGUINAL HERNIA REPAIR;  Surgeon: Tiney Rouge III, MD;  Location: [facility] ORS;  Service: General;  Laterality: Left;    Family History  Problem Relation Age of Onset  . Alcohol abuse Father   . Heart disease Father     Social History:  reports that he has never smoked. He has never used smokeless tobacco. He reports that he does not drink alcohol or use illicit drugs.  Allergies:  Allergies  Allergen Reactions  . Latex Swelling    Medications reviewed.    ROS    BP 162/92 mmHg  Pulse 73  Temp(Src) 98.2 F (36.8 C) (Oral)  Ht  (1.905 m)  Wt 94.711 kg (208 lb 12.8 oz)  BMI 26.10 kg/m2  Physical Exam abdomen is benign. There are some mild bruising around the abdominal incision but otherwise no significant groin or scrotal problems. Sutures were removed.     No results found for this or any previous visit (from the past 48 hour(s)). No results found.  Assessment/Plan:  1. Unilateral inguinal hernia without obstruction or gangrene, recurrence not specified He has done well postop. We'll plan to see him in the future as necessary. We discussed the options for surgical intervention for possible recurrence. We also talked about his exercise restrictions and will expect him to  return to near-normal shortly. He is in agreement.     Tiney Rouge III  07/18/2015,negative

## 2018-03-04 ENCOUNTER — Telehealth: Payer: Self-pay

## 2018-03-04 NOTE — Telephone Encounter (Signed)
LMTCB and schedule AWV prior to CPE apt on 03/19/18. -MM

## 2018-03-16 NOTE — Telephone Encounter (Signed)
LM to see if pt would like to come in on 03/18/18 (prior to his CPE on 03/19/18) for his AWV. -MM

## 2018-03-17 ENCOUNTER — Encounter: Payer: Medicare Other | Admitting: Family Medicine

## 2018-03-18 NOTE — Progress Notes (Signed)
Patient: Norman Wilson, Male    DOB: 1950-11-17, 67 y.o.   MRN: [id number] Visit Date: 03/19/2018  Today's Provider: Mila Merry, MD   Chief Complaint  Patient presents with  . Annual Exam   Subjective:    Annual physical Norman Wilson is a 67 y.o. male. He feels fairly well. 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 He reports he is sleeping fairly well. Feels well, still working full time.   -----------------------------------------------------------   Review of Systems  Constitutional: Negative for appetite change, chills, fatigue and fever.  HENT: Negative for congestion, ear pain, hearing loss, nosebleeds and trouble swallowing.   Eyes: Negative for pain and visual disturbance.  Respiratory: Negative for cough, chest tightness and shortness of breath.   Cardiovascular: Negative for chest pain, palpitations and leg swelling.  Gastrointestinal: Negative for abdominal pain, blood in stool, constipation, diarrhea, nausea and vomiting.  Endocrine: Negative for polydipsia, polyphagia and polyuria.  Genitourinary: Negative for dysuria and flank pain.  Musculoskeletal: Positive for arthralgias. Negative for back pain, joint swelling, myalgias and neck stiffness.  Skin: Negative for color change, rash and wound.  Neurological: Negative for dizziness, tremors, seizures, speech difficulty, weakness, light-headedness and headaches.  Psychiatric/Behavioral: Negative for behavioral problems, confusion, decreased concentration, dysphoric mood and sleep disturbance. The patient is not nervous/anxious.   All other systems reviewed and are negative.   Social History   Socioeconomic History  . Marital status: Married    Spouse name: Not on file  . Number of children: Not on file  . Years of education: Some Coll  . Highest education level: Not on file  Occupational History  . Occupation: Full-Time  Social Needs  . Financial resource strain: Not on file  . Food  insecurity:    Worry: Not on file    Inability: Not on file  . Transportation needs:    Medical: Not on file    Non-medical: Not on file  Tobacco Use  . Smoking status: Never Smoker  . Smokeless tobacco: Never Used  Substance and Sexual Activity  . Alcohol use: Yes    Alcohol/week: 0.0 oz    Comment: once a month  . Drug use: No  . Sexual activity: Not on file  Lifestyle  . Physical activity:    Days per week: Not on file    Minutes per session: Not on file  . Stress: Not on file  Relationships  . Social connections:    Talks on phone: Not on file    Gets together: Not on file    Attends religious service: Not on file    Active member of club or organization: Not on file    Attends meetings of clubs or organizations: Not on file    Relationship status: Not on file  . Intimate partner violence:    Fear of current or ex partner: Not on file    Emotionally abused: Not on file    Physically abused: Not on file    Forced sexual activity: Not on file  Other Topics Concern  . Not on file  Social History Narrative  . Not on file    Past Medical History:  Diagnosis Date  . H/O adenomatous polyp of colon 02/16/2003  . History of chicken pox      Patient Active Problem List   Diagnosis Date Noted  . History of chicken pox 07/12/2015  . Muscle strain of lower extremity 07/12/2015  . Hernia of abdominal  cavity   . Inguinal hernia 05/08/2015  . History of adenomatous polyp of colon 05/08/2015    Past Surgical History:  Procedure Laterality Date  . APPENDECTOMY  1974  . COLONOSCOPY    . ROBOT ASSISTED INGUINAL HERNIA REPAIR Left 07/09/2015   Procedure: ROBOT ASSISTED INGUINAL HERNIA REPAIR;  Surgeon: Tiney Rouge III, MD;  Location: [facility] ORS;  Service: General;  Laterality: Left;    His family history includes Alcohol abuse in his father; Heart disease in his father.      Current Outpatient Medications:  .  aspirin 81 MG tablet, Take 81 mg by mouth daily., Disp: , Rfl:    .  naproxen sodium (ANAPROX) 220 MG tablet, Take 220 mg by mouth 2 (two) times daily as needed (pain). , Disp: , Rfl:   Patient Care Team: Malva Limes, MD as PCP - General (Family Medicine)     Objective:   Vitals: BP 120/80 (BP Location: Left Arm, Patient Position: Sitting, Cuff Size: Large)   Pulse 68   Temp 98.4 F (36.9 C) (Oral)   Resp 16   Ht  (1.905 m)   Wt 198 lb (89.8 kg)   SpO2 98% Comment: room air  BMI 24.75 kg/m   Physical Exam   General Appearance:    Alert, cooperative, no distress, appears stated age  Head:    Normocephalic, without obvious abnormality, atraumatic  Eyes:    PERRL, conjunctiva/corneas clear, EOM's intact, fundi    benign, both eyes       Ears:    Normal TM's and external ear canals, both ears  Nose:   Nares normal, septum midline, mucosa normal, no drainage   or sinus tenderness  Throat:   Lips, mucosa, and tongue normal; teeth and gums normal  Neck:   Supple, symmetrical, trachea midline, no adenopathy;       thyroid:  No enlargement/tenderness/nodules; no carotid   bruit or JVD  Back:     Symmetric, no curvature, ROM normal, no CVA tenderness  Lungs:     Clear to auscultation bilaterally, respirations unlabored  Chest wall:    No tenderness or deformity  Heart:    Regular rate and rhythm, S1 and S2 normal, no murmur, rub   or gallop  Abdomen:     Soft, non-tender, bowel sounds active all four quadrants,    no masses, no organomegaly  Genitalia:    deferred  Rectal:    deferred  Extremities:   Extremities normal, atraumatic, no cyanosis or edema  Pulses:   2+ and symmetric all extremities  Skin:   Skin color, texture, turgor normal, no rashes or lesions  Lymph nodes:   Cervical, supraclavicular, and axillary nodes normal  Neurologic:   CNII-XII intact. Normal strength, sensation and reflexes      throughout    Activities of Daily Living In your present state of health, do you have any difficulty performing the following  activities: 03/19/2018  Hearing? N  Vision? N  Difficulty concentrating or making decisions? N  Walking or climbing stairs? N  Dressing or bathing? N  Doing errands, shopping? N  Some recent data might be hidden    Fall Risk Assessment Fall Risk  03/19/2018  Falls in the past year? No     Depression Screen PHQ 2/9 Scores 03/19/2018  PHQ - 2 Score 0  PHQ- 9 Score 0    Cognitive Testing - 6-CIT  Correct? Score   What year is it? yes 0 0  or 4  What month is it? yes 0 0 or 3  Memorize:    Floyde Parkins,  41,  [address]  [place],      What time is it? (within 1 hour) yes 0 0 or 3  Count backwards from 20 yes 0 0, 2, or 4  Name the months of the year yes 0 0, 2, or 4  Repeat name & address above no 3 0, 2, 4, 6, 8, or 10       TOTAL SCORE  3/28   Interpretation:  Normal  Normal (0-7) Abnormal (8-28)     Audit-C Alcohol Use Screening  Question Answer Points  How often do you have alcoholic drink? 1 times monthly 1  On days you do drink alcohol, how many drinks do you typically consume? 1 or 2 0  How oftey will you drink 6 or more in a total? never 0  Total Score:  1   A score of 3 or more in women, and 4 or more in men indicates increased risk for alcohol abuse, EXCEPT if all of the points are from question 1.     Assessment & Plan:     Annual Physical  Reviewed patient's Family Medical History Reviewed and updated list of patient's medical providers Assessment of cognitive impairment was done Assessed patient's functional ability Established a written schedule for health screening services Health Risk Assessent Completed and Reviewed  Exercise Activities and Dietary recommendations Goals    None      Immunization History  Administered Date(s) Administered  . Zoster 12/19/2013    Health Maintenance  Topic Date Due  . Hepatitis C Screening  03/02/1951  . TETANUS/TDAP  07/03/1969  . COLONOSCOPY  02/15/2013  . PNA vac Low Risk Adult (1 of 2 - PCV13)  07/04/2015  . INFLUENZA VACCINE  05/27/2018     Discussed health benefits of physical activity, and encouraged him to engage in regular exercise appropriate for his age and condition.    ------------------------------------------------------------------------------------------------------------  1. Annual physical exam Normal exam, doing well. Counseled on recommendations for Shingrix and pneumococcal vaccine. He declined pneumonia vaccine today but will take in into consideration. Advised to check with insurance regarding Shingrix.   2. Screening for colon cancer  - Cologuard  3. Prostate cancer screening  - PSA  4. Encounter for special screening examination for cardiovascular disorder  - Lipid panel - Glucose   Mila Merry, MD  [facility]

## 2018-03-18 NOTE — Telephone Encounter (Signed)
Noted, thank you

## 2018-03-18 NOTE — Telephone Encounter (Signed)
Pt declined this appt.

## 2018-03-19 ENCOUNTER — Ambulatory Visit (INDEPENDENT_AMBULATORY_CARE_PROVIDER_SITE_OTHER): Payer: Medicare Other | Admitting: Family Medicine

## 2018-03-19 ENCOUNTER — Encounter: Payer: Self-pay | Admitting: Family Medicine

## 2018-03-19 VITALS — BP 120/80 | HR 68 | Temp 98.4°F | Resp 16 | Ht 75.0 in | Wt 198.0 lb

## 2018-03-19 DIAGNOSIS — Z125 Encounter for screening for malignant neoplasm of prostate: Secondary | ICD-10-CM | POA: Diagnosis not present

## 2018-03-19 DIAGNOSIS — Z0000 Encounter for general adult medical examination without abnormal findings: Secondary | ICD-10-CM

## 2018-03-19 DIAGNOSIS — Z136 Encounter for screening for cardiovascular disorders: Secondary | ICD-10-CM | POA: Diagnosis not present

## 2018-03-19 DIAGNOSIS — Z1211 Encounter for screening for malignant neoplasm of colon: Secondary | ICD-10-CM

## 2018-03-19 NOTE — Patient Instructions (Signed)
I recommend that you get the Prevnar-13 vaccine to protect yourself from certain strains of pneumonia. Please call our office at [phone] at your earliest convenience to schedule this vaccine.    The CDC recommends two doses of Shingrix (the shingles vaccine) separated by 2 to 6 months for adults age 68 years and older. I recommend checking with your insurance plan regarding coverage for this vaccine.    Preventive Care 58 Years and Older, Male Preventive care refers to lifestyle choices and visits with your health care provider that can promote health and wellness. What does preventive care include?  A yearly physical exam. This is also called an annual well check.  Dental exams once or twice a year.  Routine eye exams. Ask your health care provider how often you should have your eyes checked.  Personal lifestyle choices, including: ? Daily care of your teeth and gums. ? Regular physical activity. ? Eating a healthy diet. ? Avoiding tobacco and drug use. ? Limiting alcohol use. ? Practicing safe sex. ? Taking low doses of aspirin every day. ? Taking vitamin and mineral supplements as recommended by your health care provider. What happens during an annual well check? The services and screenings done by your health care provider during your annual well check will depend on your age, overall health, lifestyle risk factors, and family history of disease. Counseling Your health care provider may ask you questions about your:  Alcohol use.  Tobacco use.  Drug use.  Emotional well-being.  Home and relationship well-being.  Sexual activity.  Eating habits.  History of falls.  Memory and ability to understand (cognition).  Work and work Statistician.  Screening You may have the following tests or measurements:  Height, weight, and BMI.  Blood pressure.  Lipid and cholesterol levels. These may be checked every 5 years, or more frequently if you are over 64 years  old.  Skin check.  Lung cancer screening. You may have this screening every year starting at age 19 if you have a 30-pack-year history of smoking and currently smoke or have quit within the past 15 years.  Fecal occult blood test (FOBT) of the stool. You may have this test every year starting at age 69.  Flexible sigmoidoscopy or colonoscopy. You may have a sigmoidoscopy every 5 years or a colonoscopy every 10 years starting at age 40.  Prostate cancer screening. Recommendations will vary depending on your family history and other risks.  Hepatitis C blood test.  Hepatitis B blood test.  Sexually transmitted disease (STD) testing.  Diabetes screening. This is done by checking your blood sugar (glucose) after you have not eaten for a while (fasting). You may have this done every 1-3 years.  Abdominal aortic aneurysm (AAA) screening. You may need this if you are a current or former smoker.  Osteoporosis. You may be screened starting at age 36 if you are at high risk.  Talk with your health care provider about your test results, treatment options, and if necessary, the need for more tests. Vaccines Your health care provider may recommend certain vaccines, such as:  Influenza vaccine. This is recommended every year.  Tetanus, diphtheria, and acellular pertussis (Tdap, Td) vaccine. You may need a Td booster every 10 years.  Varicella vaccine. You may need this if you have not been vaccinated.  Zoster vaccine. You may need this after age 75.  Measles, mumps, and rubella (MMR) vaccine. You may need at least one dose of MMR if you  were born in [birth year] or later. You may also need a second dose.  Pneumococcal 13-valent conjugate (PCV13) vaccine. One dose is recommended after age 77.  Pneumococcal polysaccharide (PPSV23) vaccine. One dose is recommended after age 68.  Meningococcal vaccine. You may need this if you have certain conditions.  Hepatitis A vaccine. You may need this if you  have certain conditions or if you travel or work in places where you may be exposed to hepatitis A.  Hepatitis B vaccine. You may need this if you have certain conditions or if you travel or work in places where you may be exposed to hepatitis B.  Haemophilus influenzae type b (Hib) vaccine. You may need this if you have certain risk factors.  Talk to your health care provider about which screenings and vaccines you need and how often you need them. This information is not intended to replace advice given to you by your health care provider. Make sure you discuss any questions you have with your health care provider. Document Released: 11/09/2015 Document Revised: 07/02/2016 Document Reviewed: 08/14/2015 Elsevier Interactive Patient Education  [employer].

## 2018-03-20 LAB — LIPID PANEL
CHOL/HDL RATIO: 3 ratio (ref 0.0–5.0)
CHOLESTEROL TOTAL: 156 mg/dL (ref 100–199)
HDL: 52 mg/dL (ref >39)
LDL CALC: 90 mg/dL (ref 0–99)
TRIGLYCERIDES: 69 mg/dL (ref 0–149)
VLDL Cholesterol Cal: 14 mg/dL (ref 5–40)

## 2018-03-20 LAB — GLUCOSE, RANDOM: Glucose: 69 mg/dL (ref 65–99)

## 2018-03-20 LAB — PSA: Prostate Specific Ag, Serum: 4.5 ng/mL — ABNORMAL HIGH (ref 0.0–4.0)

## 2018-03-24 ENCOUNTER — Telehealth: Payer: Self-pay | Admitting: *Deleted

## 2018-03-24 NOTE — Telephone Encounter (Signed)
No answer and no vm. Will try again later.  

## 2018-03-24 NOTE — Telephone Encounter (Signed)
-----   Message from Malva Limes, MD sent at 03/21/2018  9:19 AM EDT ----- PSA, blood sugar, kidney functions, electrolytes and cholesterol are all normal. Check labs yearly.

## 2018-03-25 ENCOUNTER — Telehealth: Payer: Self-pay | Admitting: Family Medicine

## 2018-03-25 NOTE — Telephone Encounter (Signed)
Order for cologuard faxed to [facility] °

## 2018-04-20 DIAGNOSIS — Z1211 Encounter for screening for malignant neoplasm of colon: Secondary | ICD-10-CM | POA: Diagnosis not present

## 2018-04-21 LAB — COLOGUARD: Cologuard: NEGATIVE

## 2018-04-28 ENCOUNTER — Telehealth: Payer: Self-pay

## 2018-04-28 NOTE — Telephone Encounter (Signed)
-----   Message from Malva Limesonald E Fisher, MD sent at 04/28/2018  4:48 PM EDT ----- Cologuard test is negative. Repeat in 3 years.

## 2018-04-28 NOTE — Telephone Encounter (Signed)
Tried calling patient. Left message to call back. It looks like patient still needs to be advised of his lab results too. Please advise him of both the Cologuard and lab results when he returns our call. Thanks

## 2018-04-30 NOTE — Telephone Encounter (Signed)
Tried calling patient. Left message to call back. 

## 2018-05-03 NOTE — Telephone Encounter (Signed)
Left detailed message on home number. (Per DPR.)   Thanks,   -Vernona RiegerLaura

## 2020-01-14 ENCOUNTER — Ambulatory Visit: Payer: Medicare Other | Admitting: Family Medicine

## 2020-03-20 ENCOUNTER — Other Ambulatory Visit: Payer: Self-pay

## 2020-03-20 ENCOUNTER — Ambulatory Visit (INDEPENDENT_AMBULATORY_CARE_PROVIDER_SITE_OTHER): Payer: Medicare Other | Admitting: Family Medicine

## 2020-03-20 ENCOUNTER — Encounter: Payer: Self-pay | Admitting: Family Medicine

## 2020-03-20 VITALS — BP 132/88 | HR 71 | Temp 97.5°F | Resp 16 | Ht 75.0 in | Wt 208.0 lb

## 2020-03-20 DIAGNOSIS — Z13228 Encounter for screening for other metabolic disorders: Secondary | ICD-10-CM

## 2020-03-20 DIAGNOSIS — Z0000 Encounter for general adult medical examination without abnormal findings: Secondary | ICD-10-CM

## 2020-03-20 DIAGNOSIS — Z125 Encounter for screening for malignant neoplasm of prostate: Secondary | ICD-10-CM

## 2020-03-20 DIAGNOSIS — Z23 Encounter for immunization: Secondary | ICD-10-CM

## 2020-03-20 DIAGNOSIS — G8929 Other chronic pain: Secondary | ICD-10-CM

## 2020-03-20 DIAGNOSIS — R3911 Hesitancy of micturition: Secondary | ICD-10-CM

## 2020-03-20 DIAGNOSIS — R030 Elevated blood-pressure reading, without diagnosis of hypertension: Secondary | ICD-10-CM

## 2020-03-20 DIAGNOSIS — Z136 Encounter for screening for cardiovascular disorders: Secondary | ICD-10-CM

## 2020-03-20 DIAGNOSIS — M25561 Pain in right knee: Secondary | ICD-10-CM

## 2020-03-20 NOTE — Progress Notes (Signed)
Annual Wellness Visit     Patient: Norman Wilson, Male    DOB: 1951/04/13, 69 y.o.   MRN: [id number] Visit Date: 03/20/2020  Today's Provider: Mila Merry, MD   Chief Complaint  Patient presents with  . Medicare Wellness  . Annual Exam   Subjective    Norman Wilson is a 69 y.o. male who presents today for his Annual Wellness Visit. He reports consuming a general diet. Home exercise routine includes weight lifting. He generally feels fairly well. He reports sleeping well. He does have additional problems to discuss today.   HPI He states that over the last several months he has had increasing difficulties with urinary frequency, nocturia, and hesitancy. No pain or burning with urination. Otherwise doing well.      Medications: Outpatient Medications Prior to Visit  Medication Sig  . aspirin 81 MG tablet Take 81 mg by mouth daily.  . naproxen sodium (ANAPROX) 220 MG tablet Take 220 mg by mouth 2 (two) times daily as needed (pain).    No facility-administered medications prior to visit.    Allergies  Allergen Reactions  . Latex Swelling    Patient Care Team: Malva Limes, MD as PCP - General (Family Medicine)  Review of Systems  Constitutional: Negative for appetite change, chills, fatigue and fever.  HENT: Negative for congestion, ear pain, hearing loss, nosebleeds and trouble swallowing.   Eyes: Negative for pain and visual disturbance.  Respiratory: Negative for cough, chest tightness and shortness of breath.   Cardiovascular: Negative for chest pain, palpitations and leg swelling.  Gastrointestinal: Negative for abdominal pain, blood in stool, constipation, diarrhea, nausea and vomiting.  Endocrine: Negative for polydipsia, polyphagia and polyuria.  Genitourinary: Negative for dysuria and flank pain.  Musculoskeletal: Negative for arthralgias, back pain, joint swelling, myalgias and neck stiffness.  Skin: Negative for color change, rash and wound.   Neurological: Negative for dizziness, tremors, seizures, speech difficulty, weakness, light-headedness and headaches.  Psychiatric/Behavioral: Negative for behavioral problems, confusion, decreased concentration, dysphoric mood and sleep disturbance. The patient is not nervous/anxious.   All other systems reviewed and are negative.     Objective    Vitals: BP 132/88 (BP Location: Left Arm, Patient Position: Sitting, Cuff Size: Large)   Pulse 71   Temp (!) 97.5 F (36.4 C) (Temporal)   Resp 16   Ht 6\' 3"  (1.905 m)   Wt 208 lb (94.3 kg)   SpO2 97% Comment: room air  BMI 26.00 kg/m    Physical Exam  General Appearance:     Well developed, well nourished male. Alert, cooperative, in no acute distress, appears stated age  Head:    Normocephalic, without obvious abnormality, atraumatic  Eyes:    PERRL, conjunctiva/corneas clear, EOM's intact, fundi    benign, both eyes       Ears:    Normal TM's and external ear canals, both ears  Nose:   Nares normal, septum midline, mucosa normal, no drainage   or sinus tenderness  Throat:   Lips, mucosa, and tongue normal; teeth and gums normal  Neck:   Supple, symmetrical, trachea midline, no adenopathy;       thyroid:  No enlargement/tenderness/nodules; no carotid   bruit or JVD  Back:     Symmetric, no curvature, ROM normal, no CVA tenderness  Lungs:     Clear to auscultation bilaterally, respirations unlabored  Chest wall:    No tenderness or deformity  Heart:    Normal heart  rate. Normal rhythm. No murmurs, rubs, or gallops.  S1 and S2 normal  Abdomen:     Soft, non-tender, bowel sounds active all four quadrants,    no masses, no organomegaly  Genitalia:    deferred  Rectal:    deferred  Extremities:   All extremities are intact. No cyanosis or edema  Pulses:   2+ and symmetric all extremities  Skin:   Skin color, texture, turgor normal, no rashes or lesions  Lymph nodes:   Cervical, supraclavicular, and axillary nodes normal   Neurologic:   CNII-XII intact. Normal strength, sensation and reflexes      throughout    Most recent functional status assessment: In your present state of health, do you have any difficulty performing the following activities: 03/20/2020  Hearing? N  Vision? N  Difficulty concentrating or making decisions? N  Walking or climbing stairs? N  Dressing or bathing? N  Doing errands, shopping? N  Some recent data might be hidden    Most recent fall risk assessment: Fall Risk  03/20/2020  Falls in the past year? 0  Number falls in past yr: 0  Injury with Fall? 0  Follow up Falls evaluation completed     Most recent depression screenings: PHQ 2/9 Scores 03/20/2020 03/19/2018  PHQ - 2 Score 0 0  PHQ- 9 Score - 0    Most recent cognitive screening: No flowsheet data found.  No results found for any visits on 03/20/20.  Assessment & Plan     Annual wellness visit done today including the all of the following: Reviewed patient's Family Medical History Reviewed and updated list of patient's medical providers Assessment of cognitive impairment was done Assessed patient's functional ability Established a written schedule for health screening services Health Risk Assessent Completed and Reviewed  Exercise Activities and Dietary recommendations Goals   None     Immunization History  Administered Date(s) Administered  . Zoster 12/19/2013    Health Maintenance  Topic Date Due  . Hepatitis C Screening  Never done  . COVID-19 Vaccine (1) Never done  . TETANUS/TDAP  Never done  . COLONOSCOPY  02/15/2013  . PNA vac Low Risk Adult (1 of 2 - PCV13) Never done  . INFLUENZA VACCINE  05/27/2020     Discussed health benefits of physical activity, and encouraged him to engage in regular exercise appropriate for his age and condition.   1. Annual physical exam Normal exam. Generally doing well. He states he has had two covid shots but does not have record with him today.  - PSA  Total (Reflex To Free) ([facility] only) - Comprehensive metabolic panel - Lipid panel - EKG 12-Lead  2. Medicare annual wellness visit, subsequent   3. Prostate cancer screening  - PSA Total (Reflex To Free) ([facility] only)  4. Need for vaccination against Streptococcus pneumoniae He refused pneumonia vaccine today. He also reports that he is pretty sure he has had tetanus since traveling to [place] a few years ago.   5. Encounter for special screening examination for cardiovascular disorder  - Comprehensive metabolic panel - Lipid panel - EKG 12-Lead  6. Screening for metabolic disorder  - Comprehensive metabolic panel  7. Urinary hesitancy Consider OTC Saw Palmetto extract or prescription tamsulosin after reviewing labs.   8. Chronic pain of right knee He is in process getting in with Orthopedist in [facility] and will let us know if he needs insurance referral.   9. Elevated blood pressure reading Previous reading of  162/92. Much better today. Continue healthy diet and regular exercise.  - EKG 12-Lead      The entirety of the information documented in the History of Present Illness, Review of Systems and Physical Exam were personally obtained by me. Portions of this information were initially documented by the CMA and reviewed by me for thoroughness and accuracy.      Lelon Huh, MD  [facility] [phone] (phone) [phone] (fax)  [place]

## 2020-03-20 NOTE — Patient Instructions (Addendum)
Please contact your eyecare professional to schedule a routine eye exam. I recommend seeing Dr. Edger House at [phone] or the [facility] at [phone]  . You can try OTC Saw Palmetto extract to help with urinary flow. We can also prescribed a medication called tamsulosin (Flomax) if you prefer

## 2020-03-21 LAB — COMPREHENSIVE METABOLIC PANEL
ALT: 18 IU/L (ref 0–44)
AST: 23 IU/L (ref 0–40)
Albumin/Globulin Ratio: 1.7 (ref 1.2–2.2)
Albumin: 4.3 g/dL (ref 3.8–4.8)
Alkaline Phosphatase: 93 IU/L (ref 48–121)
BUN/Creatinine Ratio: 21 (ref 10–24)
BUN: 16 mg/dL (ref 8–27)
Bilirubin Total: 0.8 mg/dL (ref 0.0–1.2)
CO2: 22 mmol/L (ref 20–29)
Calcium: 9.3 mg/dL (ref 8.6–10.2)
Chloride: 104 mmol/L (ref 96–106)
Creatinine, Ser: 0.78 mg/dL (ref 0.76–1.27)
GFR calc Af Amer: 106 mL/min/{1.73_m2} (ref >59)
GFR calc non Af Amer: 92 mL/min/{1.73_m2} (ref >59)
Globulin, Total: 2.6 g/dL (ref 1.5–4.5)
Glucose: 89 mg/dL (ref 65–99)
Potassium: 4.5 mmol/L (ref 3.5–5.2)
Sodium: 140 mmol/L (ref 134–144)
Total Protein: 6.9 g/dL (ref 6.0–8.5)

## 2020-03-21 LAB — LIPID PANEL
Chol/HDL Ratio: 3.3 ratio (ref 0.0–5.0)
Cholesterol, Total: 179 mg/dL (ref 100–199)
HDL: 54 mg/dL (ref >39)
LDL Chol Calc (NIH): 112 mg/dL — ABNORMAL HIGH (ref 0–99)
Triglycerides: 69 mg/dL (ref 0–149)
VLDL Cholesterol Cal: 13 mg/dL (ref 5–40)

## 2020-03-21 LAB — PSA TOTAL (REFLEX TO FREE): Prostate Specific Ag, Serum: 6.2 ng/mL — ABNORMAL HIGH (ref 0.0–4.0)

## 2020-03-21 LAB — FPSA% REFLEX
% FREE PSA: 34.2 %
PSA, FREE: 2.12 ng/mL

## 2020-03-22 ENCOUNTER — Telehealth: Payer: Self-pay

## 2020-03-22 DIAGNOSIS — R972 Elevated prostate specific antigen [PSA]: Secondary | ICD-10-CM

## 2020-03-22 NOTE — Telephone Encounter (Signed)
Patient advised and verbalized understanding. Patient would like to proceed with Urology referral. He doesn't have a preference; he wants to see whoever Dr. Sherrie Mustache recommends. Dr. Sherrie Mustache, do you have a preference on where to refer patient to? I pulled the Urology order and completed all questions in "ref com" except the dept and location preference. Please advise.

## 2020-03-22 NOTE — Telephone Encounter (Signed)
-----   Message from Malva Limes, MD sent at 03/21/2020 11:53 AM EDT ----- PSA has increased from 4.5 to 6.2. This is usually due to benign prostate enlargement, but is sometimes an indication of early prostate cancer.  I recommend having a urologist follow this. We can place referral, or have him let me know if there is a urologist he would rather see.  In the meantime I would just recommend OTC Saw Palmetto for his symptoms, rather than prescription medications.   Rest of labs are very good.

## 2020-04-17 ENCOUNTER — Ambulatory Visit: Payer: Medicare Other | Admitting: Urology

## 2020-04-17 ENCOUNTER — Encounter: Payer: Self-pay | Admitting: Urology

## 2020-04-17 ENCOUNTER — Other Ambulatory Visit
Admission: RE | Admit: 2020-04-17 | Discharge: 2020-04-17 | Disposition: A | Discharge status: Home / Self Care | Payer: Medicare Other | Source: Ambulatory Visit | Attending: Urology | Admitting: Urology

## 2020-04-17 ENCOUNTER — Other Ambulatory Visit: Payer: Self-pay

## 2020-04-17 VITALS — BP 135/85 | HR 70 | Ht 75.0 in | Wt 202.0 lb

## 2020-04-17 DIAGNOSIS — N401 Enlarged prostate with lower urinary tract symptoms: Secondary | ICD-10-CM | POA: Diagnosis present

## 2020-04-17 DIAGNOSIS — R3914 Feeling of incomplete bladder emptying: Secondary | ICD-10-CM

## 2020-04-17 DIAGNOSIS — R972 Elevated prostate specific antigen [PSA]: Secondary | ICD-10-CM | POA: Diagnosis not present

## 2020-04-17 LAB — URINALYSIS, COMPLETE (UACMP) WITH MICROSCOPIC
Bilirubin Urine: NEGATIVE
Glucose, UA: NEGATIVE mg/dL
Ketones, ur: NEGATIVE mg/dL
Leukocytes,Ua: NEGATIVE
Nitrite: NEGATIVE
Protein, ur: NEGATIVE mg/dL
Specific Gravity, Urine: 1.02 (ref 1.005–1.030)
pH: 6 (ref 5.0–8.0)

## 2020-04-17 LAB — BLADDER SCAN AMB NON-IMAGING: Scan Result: 664

## 2020-04-17 MED ORDER — TAMSULOSIN HCL 0.4 MG PO CAPS: 0.4000 mg | ORAL_CAPSULE | Freq: Every day | ORAL | 11 refills | Status: DC

## 2020-04-17 NOTE — Progress Notes (Signed)
04/17/20 9:49 AM   Norman Wilson Sep 10, 1951 [id number]  CC: Elevated PSA, BPH  HPI: I saw [name] today for evaluation of elevated PSA.  He is a very healthy 69 year old male who was referred for an elevated PSA of 6.2(34% free).  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  He denies any family history of prostate cancer, but his mother did have breast cancer.  Baseline PSA is 3.1 from 2015 in 2016, was mildly elevated at 4.5 in May 2019, and continue to slow rise to 6.2(34% free) in May 2021.  He also reports mild urinary symptoms of frequency and feeling of incomplete emptying.  He has tried saw palmetto for his mild urinary symptoms with only minimal improvement.  He does not like taking medications unless needed.  He denies any gross hematuria, history of retention, or history of UTIs.  Urinalysis today is benign.  PVR significantly elevated at 700 mL.   PMH: Past Medical History:  Diagnosis Date  . H/O adenomatous polyp of colon 02/16/2003  . History of chicken pox     Surgical History: Past Surgical History:  Procedure Laterality Date  . APPENDECTOMY  1974  . COLONOSCOPY    . ROBOT ASSISTED INGUINAL HERNIA REPAIR Left 07/09/2015   Procedure: ROBOT ASSISTED INGUINAL HERNIA REPAIR;  Surgeon: Dia Crawford III, MD;  Location: [facility] ORS;  Service: General;  Laterality: Left;    Family History: Family History  Problem Relation Age of Onset  . Alcohol abuse Father   . Heart disease Father     Social History:  reports that he has never smoked. He has never used smokeless tobacco. He reports current alcohol use. He reports that he does not use drugs.  Physical Exam: BP 135/85   Pulse 70   Ht 6\' 3"  (1.905 m)   Wt 202 lb (91.6 kg)   BMI 25.25 kg/m    Constitutional:  Alert and oriented, No acute distress. Cardiovascular: No clubbing, cyanosis, or edema. Respiratory: Normal respiratory effort, no increased work of breathing. GI: Abdomen is  soft, nontender, nondistended, no abdominal masses GU: Circumcised phallus without lesions, widely patent meatus, testicles 20cc and descended bilaterally DRE: 80+ gram prostate, no masses or nodules  Laboratory Data: Reviewed, see HPI  Pertinent Imaging: None to review  Assessment & Plan:   In summary, is a 69 year old male with a mildly elevated PSA of 6.2 and reassuring 34% free, very large prostate on DRE with no masses or nodules, and BPH with incomplete bladder emptying.  We a long conversation about these findings today.  I suspect his PSA is elevated secondary to his massively enlarged prostate and incomplete bladder emptying.  The percentage PSA is extremely reassuring, and we discussed that his risk of prostate cancer on biopsy would be less than 10%.  We reviewed the implications of an elevated PSA and the uncertainty surrounding it. In general, a man's PSA increases with age and is produced by both normal and cancerous prostate tissue. The differential diagnosis for elevated PSA includes BPH, prostate cancer, infection, recent intercourse/ejaculation, recent urethroscopic manipulation (foley placement/cystoscopy) or trauma, and prostatitis.   Management of an elevated PSA can include observation or prostate biopsy and we discussed this in detail. Our goal is to detect clinically significant prostate cancers, and manage with either active surveillance, surgery, or radiation for localized disease. Risks of prostate biopsy include bleeding, infection (including life threatening sepsis), pain, and lower urinary symptoms. Hematuria, hematospermia, and blood in the  stool are all common after biopsy and can persist up to 4 weeks.   -Trial of Flomax for urinary symptoms and elevated PVRs -Renal ultrasound to evaluate for hydronephrosis and measure prostate volume -Close follow-up in 1 month to review ultrasound findings, repeat PVR, and discuss urinary symptoms.  Consider teaching CIC at that  visit with his frequent international travel to [place] areas -Continue close PSA monitoring every 6 months, and consider biopsy if persistent rise or percentage free decreases   [name], MD 04/17/2020  [facility] [address] [place], [place] [address] [phone]

## 2020-04-17 NOTE — Patient Instructions (Signed)
Prostate Cancer Screening  Prostate cancer screening is a test that is done to check for the presence of prostate cancer in men. The prostate gland is a walnut-sized gland that is located below the bladder and in front of the rectum in males. The function of the prostate is to add fluid to semen during ejaculation. Prostate cancer is the second most common type of cancer in men. Who should have prostate cancer screening?  Screening recommendations vary based on age and other risk factors. Screening is recommended if:  You are older than age 55. If you are age 55-69, talk with your health care provider about your need for screening and how often screening should be done. Because most prostate cancers are slow growing and will not cause death, screening is generally reserved in this age group for men who have a 10-15-year life expectancy.  You are younger than age 55, and you have these risk factors: ? Being a black male or a male of African descent. ? Having a father, brother, or uncle who has been diagnosed with prostate cancer. The risk is higher if your family member's cancer occurred at an early age. Screening is not recommended if:  You are younger than age 40.  You are between the ages of 40 and 54 and you have no risk factors.  You are 70 years of age or older. At this age, the risks that screening can cause are greater than the benefits that it may provide. If you are at high risk for prostate cancer, your health care provider may recommend that you have screenings more often or that you start screening at a younger age. How is screening for prostate cancer done? The recommended prostate cancer screening test is a blood test called the prostate-specific antigen (PSA) test. PSA is a protein that is made in the prostate. As you age, your prostate naturally produces more PSA. Abnormally high PSA levels may be caused by:  Prostate cancer.  An enlarged prostate that is not caused by cancer  (benign prostatic hyperplasia, BPH). This condition is very common in older men.  A prostate gland infection (prostatitis). Depending on the PSA results, you may need more tests, such as:  A physical exam to check the size of your prostate gland.  Blood and imaging tests.  A procedure to remove tissue samples from your prostate gland for testing (biopsy). What are the benefits of prostate cancer screening?  Screening can help to identify cancer at an early stage, before symptoms start and when the cancer can be treated more easily.  There is a small chance that screening may lower your risk of dying from prostate cancer. The chance is small because prostate cancer is a slow-growing cancer, and most men with prostate cancer die from a different cause. What are the risks of prostate cancer screening? The main risk of prostate cancer screening is diagnosing and treating prostate cancer that would never have caused any symptoms or problems. This is called overdiagnosisand overtreatment. PSA screening cannot tell you if your PSA is high due to cancer or a different cause. A prostate biopsy is the only procedure to diagnose prostate cancer. Even the results of a biopsy may not tell you if your cancer needs to be treated. Slow-growing prostate cancer may not need any treatment other than monitoring, so diagnosing and treating it may cause unnecessary stress or other side effects. A prostate biopsy may also cause:  Infection or fever.  A false negative. This is   a result that shows that you do not have prostate cancer when you actually do have prostate cancer. Questions to ask your health care provider  When should I start prostate cancer screening?  What is my risk for prostate cancer?  How often do I need screening?  What type of screening tests do I need?  How do I get my test results?  What do my results mean?  Do I need treatment? Where to find more information  The American Cancer  Society: www.cancer.org  American Urological Association: www.auanet.org Contact a health care provider if:  You have difficulty urinating.  You have pain when you urinate or ejaculate.  You have blood in your urine or semen.  You have pain in your back or in the area of your prostate. Summary  Prostate cancer is a common type of cancer in men. The prostate gland is located below the bladder and in front of the rectum. This gland adds fluid to semen during ejaculation.  Prostate cancer screening may identify cancer at an early stage, when the cancer can be treated more easily.  The prostate-specific antigen (PSA) test is the recommended screening test for prostate cancer.  Discuss the risks and benefits of prostate cancer screening with your health care provider. If you are age 70 or older, the risks that screening can cause are greater than the benefits that it may provide. This information is not intended to replace advice given to you by your health care provider. Make sure you discuss any questions you have with your health care provider. Document Revised: 05/26/2019 Document Reviewed: 05/26/2019 Elsevier Patient Education  2020 Elsevier Inc.   Benign Prostatic Hyperplasia  Benign prostatic hyperplasia (BPH) is an enlarged prostate gland that is caused by the normal aging process and not by cancer. The prostate is a walnut-sized gland that is involved in the production of semen. It is located in front of the rectum and below the bladder. The bladder stores urine and the urethra is the tube that carries the urine out of the body. The prostate may get bigger as a man gets older. An enlarged prostate can press on the urethra. This can make it harder to pass urine. The build-up of urine in the bladder can cause infection. Back pressure and infection may progress to bladder damage and kidney (renal) failure. What are the causes? This condition is part of a normal aging process.  However, not all men develop problems from this condition. If the prostate enlarges away from the urethra, urine flow will not be blocked. If it enlarges toward the urethra and compresses it, there will be problems passing urine. What increases the risk? This condition is more likely to develop in men over the age of 50 years. What are the signs or symptoms? Symptoms of this condition include:  Getting up often during the night to urinate.  Needing to urinate frequently during the day.  Difficulty starting urine flow.  Decrease in size and strength of your urine stream.  Leaking (dribbling) after urinating.  Inability to pass urine. This needs immediate treatment.  Inability to completely empty your bladder.  Pain when you pass urine. This is more common if there is also an infection.  Urinary tract infection (UTI). How is this diagnosed? This condition is diagnosed based on your medical history, a physical exam, and your symptoms. Tests will also be done, such as:  A post-void bladder scan. This measures any amount of urine that may remain in your   bladder after you finish urinating.  A digital rectal exam. In a rectal exam, your health care provider checks your prostate by putting a lubricated, gloved finger into your rectum to feel the back of your prostate gland. This exam detects the size of your gland and any abnormal lumps or growths.  An exam of your urine (urinalysis).  A prostate specific antigen (PSA) screening. This is a blood test used to screen for prostate cancer.  An ultrasound. This test uses sound waves to electronically produce a picture of your prostate gland. Your health care provider may refer you to a specialist in kidney and prostate diseases (urologist). How is this treated? Once symptoms begin, your health care provider will monitor your condition (active surveillance or watchful waiting). Treatment for this condition will depend on the severity of your  condition. Treatment may include:  Observation and yearly exams. This may be the only treatment needed if your condition and symptoms are mild.  Medicines to relieve your symptoms, including: ? Medicines to shrink the prostate. ? Medicines to relax the muscle of the prostate.  Surgery in severe cases. Surgery may include: ? Prostatectomy. In this procedure, the prostate tissue is removed completely through an open incision or with a laparoscope or robotics. ? Transurethral resection of the prostate (TURP). In this procedure, a tool is inserted through the opening at the tip of the penis (urethra). It is used to cut away tissue of the inner core of the prostate. The pieces are removed through the same opening of the penis. This removes the blockage. ? Transurethral incision (TUIP). In this procedure, small cuts are made in the prostate. This lessens the prostate's pressure on the urethra. ? Transurethral microwave thermotherapy (TUMT). This procedure uses microwaves to create heat. The heat destroys and removes a small amount of prostate tissue. ? Transurethral needle ablation (TUNA). This procedure uses radio frequencies to destroy and remove a small amount of prostate tissue. ? Interstitial laser coagulation (ILC). This procedure uses a laser to destroy and remove a small amount of prostate tissue. ? Transurethral electrovaporization (TUVP). This procedure uses electrodes to destroy and remove a small amount of prostate tissue. ? Prostatic urethral lift. This procedure inserts an implant to push the lobes of the prostate away from the urethra. Follow these instructions at home:  Take over-the-counter and prescription medicines only as told by your health care provider.  Monitor your symptoms for any changes. Contact your health care provider with any changes.  Avoid drinking large amounts of liquid before going to bed or out in public.  Avoid or reduce how much caffeine or alcohol you  drink.  Give yourself time when you urinate.  Keep all follow-up visits as told by your health care provider. This is important. Contact a health care provider if:  You have unexplained back pain.  Your symptoms do not get better with treatment.  You develop side effects from the medicine you are taking.  Your urine becomes very dark or has a bad smell.  Your lower abdomen becomes distended and you have trouble passing your urine. Get help right away if:  You have a fever or chills.  You suddenly cannot urinate.  You feel lightheaded, or very dizzy, or you faint.  There are large amounts of blood or clots in the urine.  Your urinary problems become hard to manage.  You develop moderate to severe low back or flank pain. The flank is the side of your body between the ribs   and the hip. These symptoms may represent a serious problem that is an emergency. Do not wait to see if the symptoms will go away. Get medical help right away. Call your local emergency services (911 in the [place] Do not drive yourself to the hospital. Summary  Benign prostatic hyperplasia (BPH) is an enlarged prostate that is caused by the normal aging process and not by cancer.  An enlarged prostate can press on the urethra. This can make it hard to pass urine.  This condition is part of a normal aging process and is more likely to develop in men over the age of 50 years.  Get help right away if you suddenly cannot urinate. This information is not intended to replace advice given to you by your health care provider. Make sure you discuss any questions you have with your health care provider. Document Revised: 09/07/2018 Document Reviewed: 11/17/2016 Elsevier Patient Education  2020 Elsevier Inc.  

## 2020-05-03 ENCOUNTER — Ambulatory Visit
Admission: RE | Admit: 2020-05-03 | Discharge: 2020-05-03 | Disposition: A | Discharge status: Home / Self Care | Payer: Medicare Other | Source: Ambulatory Visit | Attending: Urology | Admitting: Urology

## 2020-05-03 ENCOUNTER — Other Ambulatory Visit: Payer: Self-pay

## 2020-05-03 DIAGNOSIS — N401 Enlarged prostate with lower urinary tract symptoms: Secondary | ICD-10-CM | POA: Insufficient documentation

## 2020-05-03 DIAGNOSIS — N281 Cyst of kidney, acquired: Secondary | ICD-10-CM | POA: Diagnosis not present

## 2020-05-03 DIAGNOSIS — R3914 Feeling of incomplete bladder emptying: Secondary | ICD-10-CM | POA: Diagnosis not present

## 2020-05-03 DIAGNOSIS — N2889 Other specified disorders of kidney and ureter: Secondary | ICD-10-CM | POA: Diagnosis not present

## 2020-05-14 ENCOUNTER — Other Ambulatory Visit: Payer: Self-pay | Admitting: *Deleted

## 2020-05-14 DIAGNOSIS — R972 Elevated prostate specific antigen [PSA]: Secondary | ICD-10-CM

## 2020-05-15 ENCOUNTER — Ambulatory Visit: Payer: Medicare Other | Admitting: Urology

## 2020-05-15 ENCOUNTER — Other Ambulatory Visit
Admission: RE | Admit: 2020-05-15 | Discharge: 2020-05-15 | Disposition: A | Discharge status: Home / Self Care | Payer: MEDICAID | Source: Ambulatory Visit | Attending: Urology | Admitting: Urology

## 2020-05-15 ENCOUNTER — Other Ambulatory Visit: Payer: Self-pay

## 2020-05-15 ENCOUNTER — Encounter: Payer: Self-pay | Admitting: Urology

## 2020-05-15 VITALS — BP 133/81 | HR 75 | Ht 75.0 in | Wt 202.0 lb

## 2020-05-15 DIAGNOSIS — R972 Elevated prostate specific antigen [PSA]: Secondary | ICD-10-CM | POA: Diagnosis not present

## 2020-05-15 DIAGNOSIS — R3914 Feeling of incomplete bladder emptying: Secondary | ICD-10-CM | POA: Diagnosis not present

## 2020-05-15 DIAGNOSIS — N401 Enlarged prostate with lower urinary tract symptoms: Secondary | ICD-10-CM | POA: Diagnosis not present

## 2020-05-15 LAB — BLADDER SCAN AMB NON-IMAGING: Scan Result: 563

## 2020-05-15 LAB — URINALYSIS, COMPLETE (UACMP) WITH MICROSCOPIC
Bacteria, UA: NONE SEEN
Bilirubin Urine: NEGATIVE
Glucose, UA: NEGATIVE mg/dL
Hgb urine dipstick: NEGATIVE
Ketones, ur: NEGATIVE mg/dL
Leukocytes,Ua: NEGATIVE
Nitrite: NEGATIVE
Protein, ur: NEGATIVE mg/dL
Specific Gravity, Urine: 1.02 (ref 1.005–1.030)
Squamous Epithelial / LPF: NONE SEEN (ref 0–5)
pH: 7 (ref 5.0–8.0)

## 2020-05-15 NOTE — Progress Notes (Signed)
   05/15/2020 9:21 AM   Norman Wilson Aug 05, 1951 [id number]  Reason for visit: Follow up BPH, elevated PSA, incomplete bladder emptying  HPI: I saw Norman Wilson back in urology clinic for the above issues.  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  PSA was elevated at 6.2, but reassuring 34% free.  When I saw him originally, he had a significantly elevated bladder scan of 750 mL, with complaints of urinary frequency and weak stream.  A renal ultrasound was performed, but fortunately showed no hydronephrosis, and renal function is normal with a creatinine of 0.7.  Prostate was massively enlarged on renal/bladder ultrasound with 150 g gland.  This equates to a reassuring PSA density of 0.04.  We started Flomax at our last visit, which she does think has significantly improve the strength of his urinary stream.  PVR remains elevated in clinic today at 500 mL, though slightly improved from prior.  We had a very long conversation about the above findings.  His mildly elevated PSA is most likely secondary to his massively enlarged prostate, and his percentage free PSA, and PSA density are both very reassuring.  We discussed that the most likely cause of his urinary symptoms are BPH.  Prostate cancer is unlikely, though we discussed without a biopsy cannot rule out prostate cancer completely.  My primary concern is his incomplete bladder emptying, and risk for upstream pressure causing renal damage.  He is very hesitant to undergo surgery with his minimal urinary symptoms.  We discussed the risks of continuing Flomax alone including urinary retention, UTIs, or renal damage.  He is amenable to close follow-up, and will think more about an outlet procedure in the future. We discussed the risks and benefits of HoLEP at length.  The procedure requires general  anesthesia and takes 2 to 3 hours, and a holmium laser is used to enucleate the prostate and push this tissue into the bladder.  A morcellator is then used to remove this tissue, which is sent for pathology.  The vast majority of patients are able to discharge the same day with a catheter in place for 2 to 3 days, and will follow-up in clinic for a voiding trial.  Approximately 5% of patients will be admitted overnight to monitor the urine, or if they have multiple co-morbidities.  We specifically discussed the risks of bleeding, infection, retrograde ejaculation, temporary urgency and urge incontinence, very low risk of long-term incontinence, pathologic evaluation of prostate tissue and possible detection of prostate cancer or other malignancy, and possible need for additional procedures.  -Continue Flomax -RTC 6 months with repeat PSA, renal ultrasound, BMP, PVR, symptom check -Re-consider HoLEP at that time if persistent urinary symptoms, elevated PVRs, or if develops UTIs or urinary retention  Sondra Come, MD  [facility] [address], [address] [place], [place] [address] [phone]

## 2020-05-15 NOTE — Patient Instructions (Signed)
Holmium Laser Enucleation of the Prostate (HoLEP)  HoLEP is a treatment for men with benign prostatic hyperplasia (BPH). The laser surgery removed blockages of urine flow, and is done without any incisions on the body.     What is HoLEP?  HoLEP is a type of laser surgery used to treat obstruction (blockage) of urine flow as a result of benign prostatic hyperplasia (BPH). In men with BPH, the prostate gland is not cancerous, but has become enlarged. An enlarged prostate can result in a number of urinary tract symptoms such as weak urinary stream, difficulty in starting urination, inability to urinate, frequent urination, or getting up at night to urinate.  HoLEP was developed in the 1990's as a more effective and less expensive surgical option for BPH, compared to other surgical options such as laser vaporization(PVP/greenlight laser), transurethral resection of the prostate(TURP), and open simple prostatectomy.   What happens during a HoLEP?  HoLEP requires general anesthesia ("asleep" throughout the procedure).   An antibiotic is given to reduce the risk of infection  A surgical instrument called a resectoscope is inserted through the urethra (the tube that carries urine from the bladder). The resectoscope has a camera that allows the surgeon to view the internal structure of the prostate gland, and to see where the incisions are being made during surgery.  The laser is inserted into the resectoscope and is used to enucleate (free up) the enlarged prostate tissue from the capsule (outer shell) and then to seal up any blood vessels. The tissue that has been removed is pushed back into the bladder.  A morcellator is placed through the resectoscope, and is used to suction out the prostate tissue that has been pushed into the bladder.  When the prostate tissue has been removed, the resectoscope is removed, and a foley catheter is placed to allow healing and drain the urine from the  bladder.     What happens after a HoLEP?  More than 90% of patients go home the same day a few hours after surgery. Less than 10% will be admitted to the hospital overnight for observation to monitor the urine, or if they have other medical problems.  Fluid is flushed through the catheter for about 1 hour after surgery to clear any blood from the urine. It is normal to have some blood in the urine after surgery. The need for blood transfusion is extremely rare.  Eating and drinking are permitted after the procedure once the patient has fully awakened from anesthesia.  The catheter is usually removed 2-3 days after surgery- the patient will come to clinic to have the catheter removed and make sure they can urinate on their own.  It is very important to drink lots of fluids after surgery for one week to keep the bladder flushed.  At first, there may be some burning with urination, but this typically improved within a few hours to days. Most patients do not have a significant amount of pain, and narcotic pain medications are rarely needed.  Symptoms of urinary frequency, urgency, and even leakage are NORMAL for the first few weeks after surgery as the bladder adjusts after having to work hard against blockage from the prostate for many years. This will improve, but can sometimes take several months.  The use of pelvic floor exercises (Kegel exercises) can help improve problems with urinary incontinence.   After catheter removal, patients will be seen at 6 weeks and 6 months for symptom check  No heavy lifting for   at least 2-3 weeks after surgery, however patients can walk and do light activities the first day after surgery. Return to work time depends on occupation.    What are the advantages of HoLEP?  HoLEP has been studied in many different parts of the world and has been shown to be a safe and effective procedure. Although there are many types of BPH surgeries available, HoLEP offers a  unique advantage in being able to remove a large amount of tissue without any incisions on the body, even in very large prostates, while decreasing the risk of bleeding and providing tissue for pathology (to look for cancer). This decreases the need for blood transfusions during surgery, minimizes hospital stay, and reduces the risk of needing repeat treatment.  What are the side effects of HoLEP?  Temporary burning and bleeding during urination. Some blood may be seen in the urine for weeks after surgery and is part of the healing process.  Urinary incontinence (inability to control urine flow) is expected in all patients immediately after surgery and they should wear pads for the first few days/weeks. This typically improves over the course of several weeks. Performing Kegel exercises can help decrease leakage from stress maneuvers such as coughing, sneezing, or lifting. The rate of long term leakage is very low. Patients may also have leakage with urgency and this may be treated with medication. The risk of urge incontinence can be dependent on several factors including age, prostate size, symptoms, and other medical problems.  Retrograde ejaculation or "backwards ejaculation." In 75% of cases, the patient will not see any fluid during ejaculation after surgery.  Erectile function is generally not significantly affected.   What are the risks of HoLEP?  Injury to the urethra or development of scar tissue at a later date  Injury to the capsule of the prostate (typically treated with longer catheterization).  Injury to the bladder or ureteral orifices (where the urine from the kidney drains out)  Infection of the bladder, testes, or kidneys  Return of urinary obstruction at a later date requiring another operation (<2%)  Need for blood transfusion or re-operation due to bleeding  Failure to relieve all symptoms and/or need for prolonged catheterization after surgery  5-15% of patients are  found to have previously undiagnosed prostate cancer in their specimen. Prostate cancer can be treated after HoLEP.  Standard risks of anesthesia including blood clots, heart attacks, etc  When should I call my doctor?  Fever over 101.3 degrees  Inability to urinate, or large blood clots in the urine     Benign Prostatic Hyperplasia  Benign prostatic hyperplasia (BPH) is an enlarged prostate gland that is caused by the normal aging process and not by cancer. The prostate is a walnut-sized gland that is involved in the production of semen. It is located in front of the rectum and below the bladder. The bladder stores urine and the urethra is the tube that carries the urine out of the body. The prostate may get bigger as a man gets older. An enlarged prostate can press on the urethra. This can make it harder to pass urine. The build-up of urine in the bladder can cause infection. Back pressure and infection may progress to bladder damage and kidney (renal) failure. What are the causes? This condition is part of a normal aging process. However, not all men develop problems from this condition. If the prostate enlarges away from the urethra, urine flow will not be blocked. If it enlarges toward   the urethra and compresses it, there will be problems passing urine. What increases the risk? This condition is more likely to develop in men over the age of 50 years. What are the signs or symptoms? Symptoms of this condition include:  Getting up often during the night to urinate.  Needing to urinate frequently during the day.  Difficulty starting urine flow.  Decrease in size and strength of your urine stream.  Leaking (dribbling) after urinating.  Inability to pass urine. This needs immediate treatment.  Inability to completely empty your bladder.  Pain when you pass urine. This is more common if there is also an infection.  Urinary tract infection (UTI). How is this diagnosed? This  condition is diagnosed based on your medical history, a physical exam, and your symptoms. Tests will also be done, such as:  A post-void bladder scan. This measures any amount of urine that may remain in your bladder after you finish urinating.  A digital rectal exam. In a rectal exam, your health care provider checks your prostate by putting a lubricated, gloved finger into your rectum to feel the back of your prostate gland. This exam detects the size of your gland and any abnormal lumps or growths.  An exam of your urine (urinalysis).  A prostate specific antigen (PSA) screening. This is a blood test used to screen for prostate cancer.  An ultrasound. This test uses sound waves to electronically produce a picture of your prostate gland. Your health care provider may refer you to a specialist in kidney and prostate diseases (urologist). How is this treated? Once symptoms begin, your health care provider will monitor your condition (active surveillance or watchful waiting). Treatment for this condition will depend on the severity of your condition. Treatment may include:  Observation and yearly exams. This may be the only treatment needed if your condition and symptoms are mild.  Medicines to relieve your symptoms, including: ? Medicines to shrink the prostate. ? Medicines to relax the muscle of the prostate.  Surgery in severe cases. Surgery may include: ? Prostatectomy. In this procedure, the prostate tissue is removed completely through an open incision or with a laparoscope or robotics. ? Transurethral resection of the prostate (TURP). In this procedure, a tool is inserted through the opening at the tip of the penis (urethra). It is used to cut away tissue of the inner core of the prostate. The pieces are removed through the same opening of the penis. This removes the blockage. ? Transurethral incision (TUIP). In this procedure, small cuts are made in the prostate. This lessens the  prostate's pressure on the urethra. ? Transurethral microwave thermotherapy (TUMT). This procedure uses microwaves to create heat. The heat destroys and removes a small amount of prostate tissue. ? Transurethral needle ablation (TUNA). This procedure uses radio frequencies to destroy and remove a small amount of prostate tissue. ? Interstitial laser coagulation (ILC). This procedure uses a laser to destroy and remove a small amount of prostate tissue. ? Transurethral electrovaporization (TUVP). This procedure uses electrodes to destroy and remove a small amount of prostate tissue. ? Prostatic urethral lift. This procedure inserts an implant to push the lobes of the prostate away from the urethra. Follow these instructions at home:  Take over-the-counter and prescription medicines only as told by your health care provider.  Monitor your symptoms for any changes. Contact your health care provider with any changes.  Avoid drinking large amounts of liquid before going to bed or out in public.    Avoid or reduce how much caffeine or alcohol you drink.  Give yourself time when you urinate.  Keep all follow-up visits as told by your health care provider. This is important. Contact a health care provider if:  You have unexplained back pain.  Your symptoms do not get better with treatment.  You develop side effects from the medicine you are taking.  Your urine becomes very dark or has a bad smell.  Your lower abdomen becomes distended and you have trouble passing your urine. Get help right away if:  You have a fever or chills.  You suddenly cannot urinate.  You feel lightheaded, or very dizzy, or you faint.  There are large amounts of blood or clots in the urine.  Your urinary problems become hard to manage.  You develop moderate to severe low back or flank pain. The flank is the side of your body between the ribs and the hip. These symptoms may represent a serious problem that is an  emergency. Do not wait to see if the symptoms will go away. Get medical help right away. Call your local emergency services (911 in the [place] Do not drive yourself to the hospital. Summary  Benign prostatic hyperplasia (BPH) is an enlarged prostate that is caused by the normal aging process and not by cancer.  An enlarged prostate can press on the urethra. This can make it hard to pass urine.  This condition is part of a normal aging process and is more likely to develop in men over the age of 50 years.  Get help right away if you suddenly cannot urinate. This information is not intended to replace advice given to you by your health care provider. Make sure you discuss any questions you have with your health care provider. Document Revised: 09/07/2018 Document Reviewed: 11/17/2016 Elsevier Patient Education  2020 [employer]  

## 2020-06-25 DIAGNOSIS — R0789 Other chest pain: Secondary | ICD-10-CM | POA: Diagnosis not present

## 2020-11-05 DIAGNOSIS — M791 Myalgia, unspecified site: Secondary | ICD-10-CM | POA: Diagnosis not present

## 2020-11-13 ENCOUNTER — Ambulatory Visit: Payer: Medicare Other | Admitting: Urology

## 2020-11-26 ENCOUNTER — Other Ambulatory Visit: Payer: Self-pay

## 2020-11-26 ENCOUNTER — Ambulatory Visit
Admission: RE | Admit: 2020-11-26 | Discharge: 2020-11-26 | Disposition: A | Discharge status: Home / Self Care | Payer: Medicare Other | Source: Ambulatory Visit | Attending: Urology | Admitting: Urology

## 2020-11-26 DIAGNOSIS — N401 Enlarged prostate with lower urinary tract symptoms: Secondary | ICD-10-CM | POA: Insufficient documentation

## 2020-11-26 DIAGNOSIS — N1330 Unspecified hydronephrosis: Secondary | ICD-10-CM | POA: Diagnosis not present

## 2020-11-26 DIAGNOSIS — R39198 Other difficulties with micturition: Secondary | ICD-10-CM | POA: Insufficient documentation

## 2020-11-26 DIAGNOSIS — R3914 Feeling of incomplete bladder emptying: Secondary | ICD-10-CM | POA: Insufficient documentation

## 2020-12-04 ENCOUNTER — Ambulatory Visit: Payer: Medicare Other | Admitting: Urology

## 2020-12-04 ENCOUNTER — Encounter: Payer: Self-pay | Admitting: Urology

## 2020-12-04 VITALS — BP 137/69 | HR 67 | Ht 75.0 in | Wt 201.0 lb

## 2020-12-04 DIAGNOSIS — R3914 Feeling of incomplete bladder emptying: Secondary | ICD-10-CM

## 2020-12-04 DIAGNOSIS — N401 Enlarged prostate with lower urinary tract symptoms: Secondary | ICD-10-CM | POA: Diagnosis not present

## 2020-12-04 DIAGNOSIS — R972 Elevated prostate specific antigen [PSA]: Secondary | ICD-10-CM | POA: Diagnosis not present

## 2020-12-04 DIAGNOSIS — N1339 Other hydronephrosis: Secondary | ICD-10-CM | POA: Diagnosis not present

## 2020-12-04 LAB — BLADDER SCAN AMB NON-IMAGING: Scan Result: 801

## 2020-12-04 NOTE — Progress Notes (Signed)
   12/04/2020 10:30 AM   Norman Wilson Nov 14, 1950 [id number]  Reason for visit: Follow up BPH, incomplete bladder emptying, elevated PSA  HPI: 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  When I originally saw him in the summer 2021 he had a significantly elevated bladder scan 759 with complaints of urinary frequency and weak stream.  Renal ultrasound at that time showed no hydronephrosis, prostate was enlarged at 100.  PSA was mildly elevated at 6.2, with reassuring 34% free, and PSA density of 0.04.  At that point, he opted to try Flomax, which improved his PVR slightly to 500 mL, and improve some of his urinary symptoms of weak stream and frequency. I recommended close follow-up with a repeat renal ultrasound, PSA, and renal function check.  He denies any major changes since our last visit.  He continues to have a weak stream and urinary frequency.  IPSS score today is 14, with quality of life mixed, and PVR significantly elevated at 801 mL.  I personally reviewed his renal ultrasound from 11/26/2020 that shows a distended bladder with new mild bilateral hydronephrosis.  PSA and BMP were not done.  We had another long conversation about these findings today, and my concern about his new hydronephrosis and worsening urinary symptoms, and significantly elevated PVR of 800 mL.  We discussed options at length including intermittent catheterization, Foley catheter placement, or an outlet procedure.  He continues to be very active in travel, and he is not interested in intermittent catheterization or a chronic Foley.  We also reviewed the role of urodynamics.  We also discussed his mildly elevated PSA, that is likely second to BPH and his 150 g prostate, and not prostate cancer, but that we cannot for sure without a prostate biopsy.  I think it is reasonable to defer  prostate biopsy based on his reassuring PSA density and significant percentage free PSA.  He is interested in pursuing an outlet procedure.  I recommended cystoscopy to rule out urethral stricture or other abnormalities, and assess his bladder, as he may have significant trabeculations.  We discussed the risks and benefits of HoLEP at length.  The procedure requires general anesthesia and takes 2 to 3 hours, and a holmium laser is used to enucleate the prostate and push this tissue into the bladder.  A morcellator is then used to remove this tissue, which is sent for pathology.  The vast majority of patients are able to discharge the same day with a catheter in place for 2 to 3 days, and will follow-up in clinic for a voiding trial.  Approximately 5% of patients will be admitted overnight to monitor the urine, or if they have multiple co-morbidities.  We specifically discussed the risks of bleeding, infection, retrograde ejaculation, temporary urgency and urge incontinence, very low risk of long-term incontinence, pathologic evaluation of prostate tissue and possible detection of prostate cancer or other malignancy, and possible need for additional procedures.  Follow up for cystoscopy and likely schedule HoLEP at that time   Norman Come, MD  [facility] [address] [place], [place] [address] [phone]

## 2020-12-04 NOTE — Patient Instructions (Signed)
Holmium Laser Enucleation of the Prostate (HoLEP)  HoLEP is a treatment for men with benign prostatic hyperplasia (BPH). The laser surgery removed blockages of urine flow, and is done without any incisions on the body.     What is HoLEP?  HoLEP is a type of laser surgery used to treat obstruction (blockage) of urine flow as a result of benign prostatic hyperplasia (BPH). In men with BPH, the prostate gland is not cancerous, but has become enlarged. An enlarged prostate can result in a number of urinary tract symptoms such as weak urinary stream, difficulty in starting urination, inability to urinate, frequent urination, or getting up at night to urinate.  HoLEP was developed in the 1990's as a more effective and less expensive surgical option for BPH, compared to other surgical options such as laser vaporization(PVP/greenlight laser), transurethral resection of the prostate(TURP), and open simple prostatectomy.   What happens during a HoLEP?  HoLEP requires general anesthesia ("asleep" throughout the procedure).   An antibiotic is given to reduce the risk of infection  A surgical instrument called a resectoscope is inserted through the urethra (the tube that carries urine from the bladder). The resectoscope has a camera that allows the surgeon to view the internal structure of the prostate gland, and to see where the incisions are being made during surgery.  The laser is inserted into the resectoscope and is used to enucleate (free up) the enlarged prostate tissue from the capsule (outer shell) and then to seal up any blood vessels. The tissue that has been removed is pushed back into the bladder.  A morcellator is placed through the resectoscope, and is used to suction out the prostate tissue that has been pushed into the bladder.  When the prostate tissue has been removed, the resectoscope is removed, and a foley catheter is placed to allow healing and drain the urine from the  bladder.     What happens after a HoLEP?  More than 90% of patients go home the same day a few hours after surgery. Less than 10% will be admitted to the hospital overnight for observation to monitor the urine, or if they have other medical problems.  Fluid is flushed through the catheter for about 1 hour after surgery to clear any blood from the urine. It is normal to have some blood in the urine after surgery. The need for blood transfusion is extremely rare.  Eating and drinking are permitted after the procedure once the patient has fully awakened from anesthesia.  The catheter is usually removed 2-3 days after surgery- the patient will come to clinic to have the catheter removed and make sure they can urinate on their own.  It is very important to drink lots of fluids after surgery for one week to keep the bladder flushed.  At first, there may be some burning with urination, but this typically improved within a few hours to days. Most patients do not have a significant amount of pain, and narcotic pain medications are rarely needed.  Symptoms of urinary frequency, urgency, and even leakage are NORMAL for the first few weeks after surgery as the bladder adjusts after having to work hard against blockage from the prostate for many years. This will improve, but can sometimes take several months.  The use of pelvic floor exercises (Kegel exercises) can help improve problems with urinary incontinence.   After catheter removal, patients will be seen at 6 weeks and 6 months for symptom check  No heavy lifting for   at least 2-3 weeks after surgery, however patients can walk and do light activities the first day after surgery. Return to work time depends on occupation.    What are the advantages of HoLEP?  HoLEP has been studied in many different parts of the world and has been shown to be a safe and effective procedure. Although there are many types of BPH surgeries available, HoLEP offers a  unique advantage in being able to remove a large amount of tissue without any incisions on the body, even in very large prostates, while decreasing the risk of bleeding and providing tissue for pathology (to look for cancer). This decreases the need for blood transfusions during surgery, minimizes hospital stay, and reduces the risk of needing repeat treatment.  What are the side effects of HoLEP?  Temporary burning and bleeding during urination. Some blood may be seen in the urine for weeks after surgery and is part of the healing process.  Urinary incontinence (inability to control urine flow) is expected in all patients immediately after surgery and they should wear pads for the first few days/weeks. This typically improves over the course of several weeks. Performing Kegel exercises can help decrease leakage from stress maneuvers such as coughing, sneezing, or lifting. The rate of long term leakage is very low. Patients may also have leakage with urgency and this may be treated with medication. The risk of urge incontinence can be dependent on several factors including age, prostate size, symptoms, and other medical problems.  Retrograde ejaculation or "backwards ejaculation." In 75% of cases, the patient will not see any fluid during ejaculation after surgery.  Erectile function is generally not significantly affected.   What are the risks of HoLEP?  Injury to the urethra or development of scar tissue at a later date  Injury to the capsule of the prostate (typically treated with longer catheterization).  Injury to the bladder or ureteral orifices (where the urine from the kidney drains out)  Infection of the bladder, testes, or kidneys  Return of urinary obstruction at a later date requiring another operation (<2%)  Need for blood transfusion or re-operation due to bleeding  Failure to relieve all symptoms and/or need for prolonged catheterization after surgery  5-15% of patients are  found to have previously undiagnosed prostate cancer in their specimen. Prostate cancer can be treated after HoLEP.  Standard risks of anesthesia including blood clots, heart attacks, etc  When should I call my doctor?  Fever over 101.3 degrees  Inability to urinate, or large blood clots in the urine     Cystoscopy Cystoscopy is a procedure that is used to help diagnose and sometimes treat conditions that affect the lower urinary tract. The lower urinary tract includes the bladder and the urethra. The urethra is the tube that drains urine from the bladder. Cystoscopy is done using a thin, tube-shaped instrument with a light and camera at the end (cystoscope). The cystoscope may be hard or flexible, depending on the goal of the procedure. The cystoscope is inserted through the urethra, into the bladder. Cystoscopy may be recommended if you have:  Urinary tract infections that keep coming back.  Blood in the urine (hematuria).  An inability to control when you urinate (urinary incontinence) or an overactive bladder.  Unusual cells found in a urine sample.  A blockage in the urethra, such as a urinary stone.  Painful urination.  An abnormality in the bladder found during an intravenous pyelogram (IVP) or CT scan. Cystoscopy may also   be done to remove a sample of tissue to be examined under a microscope (biopsy). Tell a health care provider about:  Any allergies you have.  All medicines you are taking, including vitamins, herbs, eye drops, creams, and over-the-counter medicines.  Any problems you or family members have had with anesthetic medicines.  Any blood disorders you have.  Any surgeries you have had.  Any medical conditions you have.  Whether you are pregnant or may be pregnant. What are the risks? Generally, this is a safe procedure. However, problems may occur, including:  Infection.  Bleeding.  Allergic reactions to medicines.  Damage to other structures  or organs. What happens before the procedure? Medicines Ask your health care provider about:  Changing or stopping your regular medicines. This is especially important if you are taking diabetes medicines or blood thinners.  Taking medicines such as aspirin and ibuprofen. These medicines can thin your blood. Do not take these medicines unless your health care provider tells you to take them.  Taking over-the-counter medicines, vitamins, herbs, and supplements. Tests You may have an exam or testing, such as:  X-rays of the bladder, urethra, or kidneys.  CT scan of the abdomen or pelvis.  Urine tests to check for signs of infection. General instructions  Follow instructions from your health care provider about eating or drinking restrictions.  Ask your health care provider what steps will be taken to help prevent infection. These steps may include: ? Washing skin with a germ-killing soap. ? Taking antibiotic medicine.  Plan to have a responsible adult take you home from the hospital or clinic. What happens during the procedure?  You will be given one or more of the following: ? A medicine to help you relax (sedative). ? A medicine to numb the area (local anesthetic).  The area around the opening of your urethra will be cleaned.  The cystoscope will be passed through your urethra into your bladder.  Germ-free (sterile) fluid will flow through the cystoscope to fill your bladder. The fluid will stretch your bladder so that your health care provider can clearly examine your bladder walls.  Your doctor will look at the urethra and bladder. Your doctor may take a biopsy or remove stones.  The cystoscope will be removed, and your bladder will be emptied. The procedure may vary among health care providers and hospitals.   What can I expect after the procedure? After the procedure, it is common to have:  Some soreness or pain in your abdomen and urethra.  Urinary symptoms. These  include: ? Mild pain or burning when you urinate. Pain should stop within a few minutes after you urinate. This may last for up to 1 week. ? A small amount of blood in your urine for several days. ? Feeling like you need to urinate but producing only a small amount of urine. Follow these instructions at home: Medicines  Take over-the-counter and prescription medicines only as told by your health care provider.  If you were prescribed an antibiotic medicine, take it as told by your health care provider. Do not stop taking the antibiotic even if you start to feel better. General instructions  Return to your normal activities as told by your health care provider. Ask your health care provider what activities are safe for you.  If you were given a sedative during the procedure, it can affect you for several hours. Do not drive or operate machinery until your health care provider says that it is safe.    Watch for any blood in your urine. If the amount of blood in your urine increases, call your health care provider.  Follow instructions from your health care provider about eating or drinking restrictions.  If a tissue sample was removed for testing (biopsy) during your procedure, it is up to you to get your test results. Ask your health care provider, or the department that is doing the test, when your results will be ready.  Drink enough fluid to keep your urine pale yellow.  Keep all follow-up visits. This is important. Contact a health care provider if:  You have pain that gets worse or does not get better with medicine, especially pain when you urinate.  You have trouble urinating.  You have more blood in your urine. Get help right away if:  You have blood clots in your urine.  You have abdominal pain.  You have a fever or chills.  You are unable to urinate. Summary  Cystoscopy is a procedure that is used to help diagnose and sometimes treat conditions that affect the lower  urinary tract.  Cystoscopy is done using a thin, tube-shaped instrument with a light and camera at the end.  After the procedure, it is common to have some soreness or pain in your abdomen and urethra.  Watch for any blood in your urine. If the amount of blood in your urine increases, call your health care provider.  If you were prescribed an antibiotic medicine, take it as told by your health care provider. Do not stop taking the antibiotic even if you start to feel better. This information is not intended to replace advice given to you by your health care provider. Make sure you discuss any questions you have with your health care provider. Document Revised: 05/25/2020 Document Reviewed: 05/25/2020 Elsevier Patient Education  2021 [employer]   

## 2020-12-10 ENCOUNTER — Ambulatory Visit (INDEPENDENT_AMBULATORY_CARE_PROVIDER_SITE_OTHER): Payer: Medicare Other | Admitting: Urology

## 2020-12-10 ENCOUNTER — Encounter: Payer: Self-pay | Admitting: Urology

## 2020-12-10 ENCOUNTER — Other Ambulatory Visit: Payer: Self-pay

## 2020-12-10 VITALS — BP 128/80 | HR 72 | Ht 73.0 in | Wt 201.0 lb

## 2020-12-10 DIAGNOSIS — N401 Enlarged prostate with lower urinary tract symptoms: Secondary | ICD-10-CM | POA: Diagnosis not present

## 2020-12-10 DIAGNOSIS — R3914 Feeling of incomplete bladder emptying: Secondary | ICD-10-CM

## 2020-12-10 MED ORDER — SULFAMETHOXAZOLE-TRIMETHOPRIM 800-160 MG PO TABS: 1.0000 | ORAL_TABLET | Freq: Two times a day (BID) | ORAL | 0 refills | Status: DC

## 2020-12-10 MED ORDER — SULFAMETHOXAZOLE-TRIMETHOPRIM 800-160 MG PO TABS
1.0000 | ORAL_TABLET | Freq: Once | ORAL | Status: AC
  Administered 2020-12-10: 1 via ORAL

## 2020-12-10 NOTE — Patient Instructions (Signed)

## 2020-12-10 NOTE — Progress Notes (Signed)
Cystoscopy Procedure Note:  Indication: BPH, elevated PVRs  After informed consent and discussion of the procedure and its risks, Norman Wilson was positioned and prepped in the standard fashion. Cystoscopy was performed with a flexible cystoscope. The urethra, bladder neck and entire bladder was visualized in a standard fashion. The prostate was large with trilobar hyperplasia and intravesical protrusion. Severe bladder trabeculations, large distended bladder, no suspicious lesions.  Findings: Large prostate with obstructing lateral lobes, median lobe with intravesical protrusion, severe bladder trabeculations -------------------------------------------------------------------  Assessment and Plan: 70 year old male with 150 g prostate, incomplete bladder emptying with PVRs greater than 800 mL, weak stream, and new hydronephrosis on renal ultrasound.  PSA mildly elevated at 6.2 with reassuring 34% free, most likely secondary to massively enlarged prostate and not prostate cancer.  He opted to defer biopsy which I think is reasonable prior to undergoing an outlet procedure.  We discussed the risks and benefits of HoLEP at length.  The procedure requires general anesthesia and takes 2 to 3 hours, and a holmium laser is used to enucleate the prostate and push this tissue into the bladder.  A morcellator is then used to remove this tissue, which is sent for pathology.  The vast majority of patients are able to discharge the same day with a catheter in place for 2 to 3 days, and will follow-up in clinic for a voiding trial.  Approximately 5% of patients will be admitted overnight to monitor the urine, or if they have multiple co-morbidities.  We specifically discussed the risks of bleeding, infection, retrograde ejaculation, temporary urgency and urge incontinence, very low risk of long-term incontinence, pathologic evaluation of prostate tissue and possible detection of prostate cancer or other malignancy,  and possible need for additional procedures.  We reviewed at length that with his long history of urinary symptoms and incomplete bladder emptying as well as severe bladder trabeculations, there likely will be a period of adjustment with some urgency, frequency, possible leakage, with low, but not 0, risk of long-term incontinence.  Schedule HOLEP  [name], MD 12/10/2020

## 2020-12-10 NOTE — Addendum Note (Signed)
Addended by: Frankey Shown on: 12/10/2020 02:44 PM   Modules accepted: Orders

## 2020-12-11 LAB — URINALYSIS, COMPLETE
Bilirubin, UA: NEGATIVE
Glucose, UA: NEGATIVE
Ketones, UA: NEGATIVE
Leukocytes,UA: NEGATIVE
Nitrite, UA: NEGATIVE
Protein,UA: NEGATIVE
Specific Gravity, UA: 1.02 (ref 1.005–1.030)
Urobilinogen, Ur: 0.2 mg/dL (ref 0.2–1.0)
pH, UA: 6.5 (ref 5.0–7.5)

## 2020-12-11 LAB — MICROSCOPIC EXAMINATION

## 2020-12-13 LAB — CULTURE, URINE COMPREHENSIVE

## 2020-12-17 ENCOUNTER — Ambulatory Visit: Payer: Medicare Other | Admitting: Physician Assistant

## 2020-12-17 ENCOUNTER — Other Ambulatory Visit: Payer: Self-pay | Admitting: Urology

## 2020-12-18 ENCOUNTER — Inpatient Hospital Stay
Admission: RE | Admit: 2020-12-18 | Discharge: 2020-12-18 | Disposition: A | Discharge status: Home / Self Care | Payer: Medicare Other | Source: Ambulatory Visit

## 2020-12-18 NOTE — Pre-Procedure Instructions (Signed)
Multiple attempts to reach patient for his scheduled pre admission interview/instructions for up coming procedure, not able to leave a message.Message placed with [facility] urological making them aware. I will reschedule appointment .

## 2020-12-18 NOTE — Patient Instructions (Signed)
Your procedure is scheduled on: 12/28/2020 Report to the Registration Desk on the 1st floor of the [facility]. To find out your arrival time, please call [phone] between 1PM - 3PM on: 12/27/2020  REMEMBER: Instructions that are not followed completely may result in serious medical risk, up to and including death; or upon the discretion of your surgeon and anesthesiologist your surgery may need to be rescheduled.  Do not eat food or drink any fluid after midnight the night before surgery.  No gum chewing, lozengers or hard candies.   TAKE THESE MEDICATIONS THE MORNING OF SURGERY WITH A SIP OF WATER:  Follow recommendations from Cardiologist, Pulmonologist or PCP regarding stopping Aspirin, Coumadin, Plavix, Eliquis, Pradaxa, or Pletal.  One week prior to surgery: Stop taking 12/21/20 Stop Anti-inflammatories (NSAIDS) such as Advil, Aleve, Ibuprofen, Motrin, Naproxen, Naprosyn and Aspirin based products such as Excedrin, Goodys Powder, BC Powder.  Stop ANY OVER THE COUNTER supplements until after surgery. (However, you may continue taking Vitamin D, Vitamin B, and multivitamin up until the day before surgery.)  No Alcohol for 24 hours before or after surgery.  No Smoking including e-cigarettes for 24 hours prior to surgery.  No chewable tobacco products for at least 6 hours prior to surgery.  No nicotine patches on the day of surgery.  Do not use any "recreational" drugs for at least a week prior to your surgery.  Please be advised that the combination of cocaine and anesthesia may have negative outcomes, up to and including death. If you test positive for cocaine, your surgery will be cancelled.  On the morning of surgery brush your teeth with toothpaste and water, you may rinse your mouth with mouthwash if you wish. Do not swallow any toothpaste or mouthwash.  Do not wear jewelry, make-up, hairpins, clips or nail polish.  Do not wear lotions, powders, or perfumes.   Do  not shave body from the neck down 48 hours prior to surgery just in case you cut yourself which could leave a site for infection.  Also, freshly shaved skin may become irritated if using the CHG soap.  Contact lenses, hearing aids and dentures may not be worn into surgery.  Do not bring valuables to the hospital. [facility] is not responsible for any missing/lost belongings or valuables.   Notify your doctor if there is any change in your medical condition (cold, fever, infection).  Wear comfortable clothing (specific to your surgery type) to the hospital.  Plan for stool softeners for home use; pain medications have a tendency to cause constipation. You can also help prevent constipation by eating foods high in fiber such as fruits and vegetables and drinking plenty of fluids as your diet allows.  After surgery, you can help prevent lung complications by doing breathing exercises.  Take deep breaths and cough every 1-2 hours. Your doctor may order a device called an Incentive Spirometer to help you take deep breaths. When coughing or sneezing, hold a pillow firmly against your incision with both hands. This is called "splinting." Doing this helps protect your incision. It also decreases belly discomfort.  If you are being admitted to the hospital overnight, leave your suitcase in the car. After surgery it may be brought to your room.  If you are being discharged the day of surgery, you will not be allowed to drive home. You will need a responsible adult (18 years or older) to drive you home and stay with you that night.   If you  are taking public transportation, you will need to have a responsible adult (18 years or older) with you. Please confirm with your physician that it is acceptable to use public transportation.   Please call the Pre-admissions Testing Dept. at [phone] if you have any questions about these instructions.  Visitation Policy:  Patients undergoing a surgery  or procedure may have one family member or support person with them as long as that person is not COVID-19 positive or experiencing its symptoms.  That person may remain in the waiting area during the procedure.  Inpatient Visitation:    Visiting hours are 7 a.m. to 8 p.m. Patients will be allowed one visitor. The visitor may change daily. The visitor must pass COVID-19 screenings, use hand sanitizer when entering and exiting the patient's room and wear a mask at all times, including in the patient's room. Patients must also wear a mask when staff or their visitor are in the room. Masking is required regardless of vaccination status. Systemwide, no visitors 17 or younger.

## 2020-12-20 ENCOUNTER — Other Ambulatory Visit: Admission: RE | Admit: 2020-12-20 | Payer: MEDICAID | Source: Ambulatory Visit

## 2020-12-21 ENCOUNTER — Inpatient Hospital Stay
Admission: RE | Admit: 2020-12-21 | Discharge: 2020-12-21 | Disposition: A | Discharge status: Home / Self Care | Payer: Medicare Other | Source: Ambulatory Visit

## 2020-12-21 NOTE — Pre-Procedure Instructions (Signed)
See previous note-multiple attempts made to pts home, cell and work and unable to leave a message on any of these lines. Leah from Dr Kathlee Nations office aware and she tried him today on all these numbers along with his wifes # and was unable to get in touch with pt

## 2020-12-24 ENCOUNTER — Inpatient Hospital Stay
Admission: RE | Admit: 2020-12-24 | Discharge: 2020-12-24 | Disposition: A | Discharge status: Home / Self Care | Payer: Medicare Other | Source: Ambulatory Visit

## 2020-12-24 ENCOUNTER — Telehealth: Payer: Self-pay | Admitting: Urology

## 2020-12-24 NOTE — Telephone Encounter (Signed)
FYI- Pre Admit Nurse nor I have been able to reach this pt.  We have been calling him since 12/20/20 with no return call. I have called home,work, mobile and Spouse's number and am unable to even leave a message due to no voice mail being set up.(even for the work numbers listed which is strange) He was considered no show today for his preop appointment and his Covid test is scheduled for Wednesday 12/26/20.

## 2020-12-24 NOTE — Telephone Encounter (Signed)
He does a lot of international travel, so I suspect he is just out of the country.  Would recommend continuing to call daily, as I think he is only back in the country for 2 weeks for the procedure.  [name], MD 12/24/2020

## 2020-12-26 ENCOUNTER — Other Ambulatory Visit: Payer: Self-pay

## 2020-12-26 ENCOUNTER — Other Ambulatory Visit
Admission: RE | Admit: 2020-12-26 | Discharge: 2020-12-26 | Disposition: A | Discharge status: Home / Self Care | Payer: Medicare Other | Source: Ambulatory Visit | Attending: Urology | Admitting: Urology

## 2020-12-26 DIAGNOSIS — Z01812 Encounter for preprocedural laboratory examination: Secondary | ICD-10-CM | POA: Insufficient documentation

## 2020-12-26 DIAGNOSIS — Z20822 Contact with and (suspected) exposure to covid-19: Secondary | ICD-10-CM | POA: Diagnosis not present

## 2020-12-26 LAB — SARS CORONAVIRUS 2 (TAT 6-24 HRS): SARS Coronavirus 2: NEGATIVE

## 2020-12-26 NOTE — Pre-Procedure Instructions (Signed)
Called and left message for Tacey Ruiz about this pt-I called over to Dr Don Perking office to confirm to see if pt had ever had his stress test done. Dr Don Perking office said the order was placed and insurance stated it would be covered but that pt never had his stress test done. I told her that Anesthesia said this would have to be done before his HOLEP on 3-4. I informed Dr Keane Scrape office last week that this stress test was never done.  I just messaged Dr Gwenyth Bender (anesthesia) about this as well and he said stress test definitely needs to be done before he can have HOLEP. I called and spoke with Amy about this as well and informed her of all of this.  She said that Leah leaves at 4 and that she will inform her of this in the morning.

## 2020-12-26 NOTE — Pre-Procedure Instructions (Signed)
Norman Ar, MD - 06/25/2020 9:15 AM EDT Formatting of this note is different from the original.    Chief Complaint: Chief Complaint  Patient presents with  . Establish Care  . Chest Pain  Date of Service: 06/25/2020 Date of Birth: Aug 06, 1951 PCP: [facility]  History of Present Illness: Norman Wilson is a 70 y.o.male patient who presents for evaluation. He has had some chest pain with both typical and atypical features. He was previously evaluated 5 or 6 years ago after a syncopal episode that occurred in a dehydrated state. He has done well in the interim. He is quite vigorous. He received his [employer] vaccine in April and since that time has had brief episodes of chest discomfort. These are not necessarily exertional but does have some shortness of breath with the episodes. He denies syncope or presyncope. Electrocardiogram done today revealed sinus rhythm at a rate of 65 with a PR interval of 162 ms, QRS duration of 90 ms with a QTC of 432 ms and QRS axis of 47 degrees. There is no ischemia. Risk factors include family history denies hypertension hyperlipidemia diabetes and does not smoke.  Past Medical and Surgical History  Past Medical History History reviewed. No pertinent past medical history.  Past Surgical History He has no past surgical history on file.   Medications and Allergies  Current Medications  Current Outpatient Medications  Medication Sig Dispense Refill  . aspirin 81 MG EC tablet Take 1 tablet by mouth once daily  . naproxen sodium (ALEVE) 220 MG tablet Take 220 mg by mouth 2 (two) times daily   No current facility-administered medications for this visit.   Allergies: Patient has no known allergies.  Social and Family History  Social History reports that he has quit smoking. He has never used smokeless tobacco. He reports that he does not drink alcohol and does not use drugs.  Family History History reviewed. No pertinent family  history.  Review of Systems  Review of Systems  Constitutional: Negative for chills, diaphoresis, fever, malaise/fatigue and weight loss.  HENT: Negative for congestion, ear discharge, hearing loss and tinnitus.  Eyes: Negative for blurred vision.  Respiratory: Negative for cough, hemoptysis, sputum production, shortness of breath and wheezing.  Cardiovascular: Positive for chest pain. Negative for palpitations, orthopnea, claudication, leg swelling and PND.  Gastrointestinal: Negative for abdominal pain, blood in stool, constipation, diarrhea, heartburn, melena, nausea and vomiting.  Genitourinary: Negative for dysuria, frequency, hematuria and urgency.  Musculoskeletal: Negative for back pain, falls, joint pain and myalgias.  Skin: Negative for itching and rash.  Neurological: Negative for dizziness, tingling, focal weakness, loss of consciousness, weakness and headaches.  Endo/Heme/Allergies: Negative for polydipsia. Does not bruise/bleed easily.  Psychiatric/Behavioral: Negative for depression, memory loss and substance abuse. The patient is not nervous/anxious.   Physical Examination   Vitals:BP 130/60  Pulse 64  Resp 16  Ht 190.5 cm (6\' 3" )  Wt 89.4 kg (197 lb)  SpO2 98%  BMI 24.62 kg/m  Ht:190.5 cm (6\' 3" ) Wt:89.4 kg (197 lb) surface area is 2.18 meters squared. Body mass index is 24.62 kg/m.  Wt Readings from Last 3 Encounters:  06/25/20 89.4 kg (197 lb)   BP Readings from Last 3 Encounters:  06/25/20 130/60   General appearance appears in no acute distress  Head Mouth and Eye exam Normocephalic, without obvious abnormality, atraumatic Dentition is good Eyes appear anicteric       LUNGS Breath Sounds: Normal Percussion: Normal  CARDIOVASCULAR JVP CV wave: no HJR: no Elevation at 90 degrees: None Carotid Pulse: normal pulsation bilaterally Bruit: None Apex: apical impulse normal  Auscultation Rhythm: normal sinus rhythm S1: normal S2:  normal Clicks: no Rub: no Murmurs: no murmurs  Gallop: None  EXTREMITIES Clubbing: no Edema: trace to 1+ bilateral pedal edema Pulses: peripheral pulses symmetrical Femoral Bruits: no Amputation: no SKIN Rash: no Cyanosis: no Embolic phemonenon: no Bruising: no NEURO Alert and Oriented to person, place and time: yes Non focal: yes  PSYCH: Pt appears to have normal affect  LABS REVIEWED Last 3 CBC results: No results found for: WBC No results found for: HGB No results found for: HCT  No results found for: PLT  No results found for: CREATININE, BUN, NA, K, CL, CO2  No results found for: HGBA1C  No results found for: HDL No results found for: LDLCALC No results found for: TRIG  No results found for: ALT, AST, GGT, ALKPHOS, BILITOT  No results found for: TSH  Diagnostic Studies Reviewed:  EKG EKG demonstrated normal sinus rhythm, nonspecific ST and T waves changes.  Assessment and Plan   70 y.o. male with  ICD-10-CM  1. Atypical chest pain-chest pain has both typical atypical features. We will proceed with an exercise stress echo due to chest discomfort after the Covid vaccine to evaluate for evidence of cardiomyopathy both at rest and then evaluate echo at stress to evaluate for exercise-induced arrhythmia or ischemia to guide further therapy. R07.89 ECG 12-lead  ECG stress test only  Echo stress test   No follow-ups on file.  These notes generated with voice recognition software. I apologize for typographical errors.  Norman Ar, MD    Electronically signed by Norman Ar, MD at 06/25/2020 10:30 AM EDT   Plan of Treatment - documented as of this encounter  Scheduled Orders Scheduled Orders  Name Type Priority Associated Diagnoses Order Schedule  ECG stress test only ECG Routine Atypical chest pain  Expected: 07/09/2020, Expires: 12/24/2020  Echo stress test Echocardiography Routine Atypical chest pain  Expected: 07/02/2020,  Expires: 06/26/2021   Procedures - documented in this encounter  Procedure Name Priority Date/Time Associated Diagnosis Comments  PR ELECTROCARDIOGRAM, COMPLETE Routine 06/25/2020 9:19 AM EDT Atypical chest pain  Results for this procedure are in the results section.    ECG Results - documented in this encounter   ECG 12-lead (06/25/2020 9:19 AM EDT) ECG 12-lead (06/25/2020 9:19 AM EDT)  Component Value Ref Range Performed At Pathologist Signature  Vent Rate (bpm) 65  [facility] RESULTS   PR Interval (msec) 162  [facility] RESULTS   QRS Interval (msec) 90  [facility] RESULTS   QT Interval (msec) 416  [facility] RESULTS   QTc (msec) 432  [facility] RESULTS    ECG 12-lead (06/25/2020 9:19 AM EDT)  Specimen     ECG 12-lead (06/25/2020 9:19 AM EDT)  Narrative Performed At  This result has an attachment that is not available.  Normal sinus rhythm Normal ECG No previous ECGs available I reviewed and concur with this report. Electronically signed HT:DSKA MD, KEN [phone]) on 07/04/2020 8:02:59 AM   [facility] RESULTS    ECG 12-lead (06/25/2020 9:19 AM EDT)  Performing Organization Address City/State/ZIP Code Phone Number  [facility] RESULTS        Visit Diagnoses - documented in this encounter  Diagnosis  Atypical chest pain - Primary  Other chest pain  Historical Medications - added in this encounter  This list may reflect changes made after this encounter.  Medication Sig Dispensed Refills Start Date End Date  naproxen sodium (ALEVE) 220 MG tablet  Take 220 mg by mouth 2 (two) times daily  0    aspirin 81 MG EC tablet  Take 1 tablet by mouth once daily  0     Images Patient Demographics  Patient Address Communication Language Race / Ethnicity Marital Status  [address] [place], [place] [address] [phone] [facility]) [phone] [facility]) [phone] (Work) [email or web address] English (Preferred) White / Not Hispanic or  Latino Married   Patient Contacts  Contact Name Contact Address Communication Relationship to Patient  Mate Alegria Unknown [phone] [facility]) Spouse, Emergency Contact   Document Information  Primary Care Provider Other Service Providers Document Coverage Dates  [facility] (Aug. 30, 2021August 30, 2021 - Present) [phone] (Work) [phone] (Fax) [address] [place], [place] [address]     Aug. 30, 2021August 30, 2021   Custodian Organization  [facility] [address] [place], [place] [address]   Encounter Providers Encounter Date  Norman Ar, MD (Attending) [phone] (Work) [phone] (Fax) [address] [place], [place] [address] Cardiovascular [facility] Aug. 30, 2021August 30, 2021    Show All Sections

## 2020-12-27 ENCOUNTER — Encounter
Admission: RE | Admit: 2020-12-27 | Discharge: 2020-12-27 | Disposition: A | Discharge status: Home / Self Care | Payer: MEDICAID | Source: Ambulatory Visit | Attending: Urology | Admitting: Urology

## 2020-12-27 NOTE — Patient Instructions (Signed)
Your procedure is scheduled on:12-28-20 FRIDAY Report to the Registration Desk on the 1st floor of the [facility] AT 6 AM  REMEMBER: Instructions that are not followed completely may result in serious medical risk, up to and including death; or upon the discretion of your surgeon and anesthesiologist your surgery may need to be rescheduled.  Do not eat food or drink liquids after midnight the night before surgery.  No gum chewing, lozengers or hard candies.  TAKE THESE MEDICATIONS THE MORNING OF SURGERY WITH A SIP OF WATER: -NONE  Follow recommendations from Cardiologist, Pulmonologist or PCP regarding stopping Aspirin, Coumadin, Plavix, Eliquis, Pradaxa, or Pletal-ASPIRIN WAS STOPPED LAST WEEK PER INSTRUCTION FROM DR SNINSKY'S OFFICE  One week prior to surgery: Stop Anti-inflammatories (NSAIDS) such as Advil, Aleve, Ibuprofen, Motrin, Naproxen, Naprosyn and Aspirin based products such as Excedrin, Goodys Powder, BC Powder-OK TO TAKE TYLENOL-LAST DOSE OF NAPROXEN WAS TODAY (12-27-20)  No Alcohol for 24 hours before or after surgery.  No Smoking including e-cigarettes for 24 hours prior to surgery.  No chewable tobacco products for at least 6 hours prior to surgery.  No nicotine patches on the day of surgery.  Do not use any "recreational" drugs for at least a week prior to your surgery.  Please be advised that the combination of cocaine and anesthesia may have negative outcomes, up to and including death. If you test positive for cocaine, your surgery will be cancelled.  On the morning of surgery brush your teeth with toothpaste and water, you may rinse your mouth with mouthwash if you wish. Do not swallow any toothpaste or mouthwash.  Do not wear jewelry, make-up, hairpins, clips or nail polish.  Do not wear lotions, powders, or perfumes.   Do not shave body from the neck down 48 hours prior to surgery just in case you cut yourself which could leave a site for infection.   Also, freshly shaved skin may become irritated if using the CHG soap.  Contact lenses, hearing aids and dentures may not be worn into surgery.  Do not bring valuables to the hospital. [facility] is not responsible for any missing/lost belongings or valuables.   Notify your doctor if there is any change in your medical condition (cold, fever, infection).  Wear comfortable clothing (specific to your surgery type) to the hospital.  Plan for stool softeners for home use; pain medications have a tendency to cause constipation. You can also help prevent constipation by eating foods high in fiber such as fruits and vegetables and drinking plenty of fluids as your diet allows.  After surgery, you can help prevent lung complications by doing breathing exercises.  Take deep breaths and cough every 1-2 hours. Your doctor may order a device called an Incentive Spirometer to help you take deep breaths. When coughing or sneezing, hold a pillow firmly against your incision with both hands. This is called "splinting." Doing this helps protect your incision. It also decreases belly discomfort.  If you are being admitted to the hospital overnight, leave your suitcase in the car. After surgery it may be brought to your room.  If you are being discharged the day of surgery, you will not be allowed to drive home. You will need a responsible adult (18 years or older) to drive you home and stay with you that night.   If you are taking public transportation, you will need to have a responsible adult (18 years or older) with you. Please confirm with your physician that it  is acceptable to use public transportation.   Please call the Pre-admissions Testing Dept. at [phone] if you have any questions about these instructions.  Surgery Visitation Policy:  Patients undergoing a surgery or procedure may have one family member or support person with them as long as that person is not COVID-19 positive or  experiencing its symptoms.  That person may remain in the waiting area during the procedure.  Inpatient Visitation:    Visiting hours are 7 a.m. to 8 p.m. Inpatients will be allowed two visitors daily. The visitors may change each day during the patient's stay. No visitors under the age of 6. Any visitor under the age of 82 must be accompanied by an adult. The visitor must pass COVID-19 screenings, use hand sanitizer when entering and exiting the patient's room and wear a mask at all times, including in the patient's room. Patients must also wear a mask when staff or their visitor are in the room. Masking is required regardless of vaccination status.

## 2020-12-28 ENCOUNTER — Encounter: Payer: Self-pay | Admitting: Urology

## 2020-12-28 ENCOUNTER — Ambulatory Visit: Payer: Medicare Other | Admitting: Anesthesiology

## 2020-12-28 ENCOUNTER — Other Ambulatory Visit: Payer: Self-pay

## 2020-12-28 ENCOUNTER — Encounter
Admission: RE | Disposition: A | Discharge status: Home / Self Care | Payer: Self-pay | Source: Ambulatory Visit | Attending: Urology

## 2020-12-28 ENCOUNTER — Ambulatory Visit
Admission: RE | Admit: 2020-12-28 | Discharge: 2020-12-28 | Disposition: A | Discharge status: Home / Self Care | Payer: Medicare Other | Source: Ambulatory Visit | Attending: Urology | Admitting: Urology

## 2020-12-28 ENCOUNTER — Ambulatory Visit: Payer: Medicare Other | Admitting: Urgent Care

## 2020-12-28 DIAGNOSIS — R3914 Feeling of incomplete bladder emptying: Secondary | ICD-10-CM | POA: Diagnosis not present

## 2020-12-28 DIAGNOSIS — R3912 Poor urinary stream: Secondary | ICD-10-CM | POA: Diagnosis not present

## 2020-12-28 DIAGNOSIS — N1330 Unspecified hydronephrosis: Secondary | ICD-10-CM | POA: Diagnosis not present

## 2020-12-28 DIAGNOSIS — N401 Enlarged prostate with lower urinary tract symptoms: Secondary | ICD-10-CM

## 2020-12-28 DIAGNOSIS — R350 Frequency of micturition: Secondary | ICD-10-CM | POA: Insufficient documentation

## 2020-12-28 HISTORY — PX: HOLEP-LASER ENUCLEATION OF THE PROSTATE WITH MORCELLATION: SHX6641

## 2020-12-28 SURGERY — ENUCLEATION, PROSTATE, USING LASER, WITH MORCELLATION
Anesthesia: General | Site: Prostate

## 2020-12-28 MED ORDER — FAMOTIDINE 20 MG PO TABS
ORAL_TABLET | ORAL | Status: AC
  Administered 2020-12-28: 20 mg via ORAL
  Filled 2020-12-28: qty 1

## 2020-12-28 MED ORDER — ROCURONIUM BROMIDE 10 MG/ML (PF) SYRINGE
PREFILLED_SYRINGE | INTRAVENOUS | Status: AC
  Filled 2020-12-28: qty 10

## 2020-12-28 MED ORDER — PROPOFOL 10 MG/ML IV BOLUS
INTRAVENOUS | Status: AC
  Filled 2020-12-28: qty 20

## 2020-12-28 MED ORDER — FENTANYL CITRATE (PF) 100 MCG/2ML IJ SOLN
INTRAMUSCULAR | Status: AC
  Filled 2020-12-28: qty 2

## 2020-12-28 MED ORDER — LIDOCAINE HCL (PF) 2 % IJ SOLN
INTRAMUSCULAR | Status: AC
  Filled 2020-12-28: qty 5

## 2020-12-28 MED ORDER — FENTANYL CITRATE (PF) 100 MCG/2ML IJ SOLN: 25.0000 ug | INTRAMUSCULAR | Status: DC | PRN

## 2020-12-28 MED ORDER — ORAL CARE MOUTH RINSE: 15.0000 mL | Freq: Once | OROMUCOSAL | Status: AC

## 2020-12-28 MED ORDER — SUCCINYLCHOLINE CHLORIDE 200 MG/10ML IV SOSY
PREFILLED_SYRINGE | INTRAVENOUS | Status: AC
  Filled 2020-12-28: qty 10

## 2020-12-28 MED ORDER — FENTANYL CITRATE (PF) 100 MCG/2ML IJ SOLN
INTRAMUSCULAR | Status: DC | PRN
  Administered 2020-12-28 (×2): 50 ug via INTRAVENOUS

## 2020-12-28 MED ORDER — PROPOFOL 10 MG/ML IV BOLUS
INTRAVENOUS | Status: DC | PRN
  Administered 2020-12-28: 50 mg via INTRAVENOUS
  Administered 2020-12-28: 40 mg via INTRAVENOUS
  Administered 2020-12-28: 160 mg via INTRAVENOUS

## 2020-12-28 MED ORDER — LIDOCAINE HCL (CARDIAC) PF 100 MG/5ML IV SOSY
PREFILLED_SYRINGE | INTRAVENOUS | Status: DC | PRN
  Administered 2020-12-28: 100 mg via INTRAVENOUS

## 2020-12-28 MED ORDER — EPHEDRINE 5 MG/ML INJ
INTRAVENOUS | Status: AC
  Filled 2020-12-28: qty 10

## 2020-12-28 MED ORDER — ONDANSETRON HCL 4 MG/2ML IJ SOLN
INTRAMUSCULAR | Status: AC
  Filled 2020-12-28: qty 2

## 2020-12-28 MED ORDER — BELLADONNA ALKALOIDS-OPIUM 16.2-60 MG RE SUPP
RECTAL | Status: DC | PRN
  Administered 2020-12-28: 1 via RECTAL

## 2020-12-28 MED ORDER — CEFAZOLIN SODIUM-DEXTROSE 2-4 GM/100ML-% IV SOLN
2.0000 g | Freq: Three times a day (TID) | INTRAVENOUS | Status: DC
  Administered 2020-12-28: 2 g via INTRAVENOUS

## 2020-12-28 MED ORDER — HYDROCODONE-ACETAMINOPHEN 5-325 MG PO TABS: 1.0000 | ORAL_TABLET | Freq: Four times a day (QID) | ORAL | 0 refills | Status: AC | PRN

## 2020-12-28 MED ORDER — BELLADONNA ALKALOIDS-OPIUM 16.2-60 MG RE SUPP
RECTAL | Status: AC
  Filled 2020-12-28: qty 1

## 2020-12-28 MED ORDER — LACTATED RINGERS IV SOLN: INTRAVENOUS | Status: DC

## 2020-12-28 MED ORDER — SUGAMMADEX SODIUM 200 MG/2ML IV SOLN
INTRAVENOUS | Status: DC | PRN
  Administered 2020-12-28: 100 mg via INTRAVENOUS

## 2020-12-28 MED ORDER — ONDANSETRON HCL 4 MG/2ML IJ SOLN: 4.0000 mg | Freq: Once | INTRAMUSCULAR | Status: DC | PRN

## 2020-12-28 MED ORDER — ONDANSETRON HCL 4 MG/2ML IJ SOLN
INTRAMUSCULAR | Status: DC | PRN
  Administered 2020-12-28: 4 mg via INTRAVENOUS

## 2020-12-28 MED ORDER — EPHEDRINE SULFATE 50 MG/ML IJ SOLN
INTRAMUSCULAR | Status: DC | PRN
  Administered 2020-12-28 (×2): 10 mg via INTRAVENOUS

## 2020-12-28 MED ORDER — FAMOTIDINE 20 MG PO TABS: 20.0000 mg | ORAL_TABLET | Freq: Once | ORAL | Status: AC

## 2020-12-28 MED ORDER — DEXAMETHASONE SODIUM PHOSPHATE 10 MG/ML IJ SOLN
INTRAMUSCULAR | Status: AC
  Filled 2020-12-28: qty 1

## 2020-12-28 MED ORDER — DEXAMETHASONE SODIUM PHOSPHATE 10 MG/ML IJ SOLN
INTRAMUSCULAR | Status: DC | PRN
  Administered 2020-12-28: 5 mg via INTRAVENOUS

## 2020-12-28 MED ORDER — CHLORHEXIDINE GLUCONATE 0.12 % MT SOLN
OROMUCOSAL | Status: AC
  Administered 2020-12-28: 15 mL via OROMUCOSAL
  Filled 2020-12-28: qty 15

## 2020-12-28 MED ORDER — CHLORHEXIDINE GLUCONATE 0.12 % MT SOLN: 15.0000 mL | Freq: Once | OROMUCOSAL | Status: AC

## 2020-12-28 MED ORDER — ROCURONIUM BROMIDE 100 MG/10ML IV SOLN
INTRAVENOUS | Status: DC | PRN
  Administered 2020-12-28: 40 mg via INTRAVENOUS

## 2020-12-28 SURGICAL SUPPLY — 38 items
ADAPTER IRRIG TUBE 2 SPIKE SOL (ADAPTER) ×4 IMPLANT
ADPR TBG 2 SPK PMP STRL ASCP (ADAPTER) ×2
BAG DRN LRG CPC RND TRDRP CNTR (MISCELLANEOUS) ×1
BAG URO DRAIN 4000ML (MISCELLANEOUS) ×2 IMPLANT
CATH FOLEY 3WAY 30CC 24FR (CATHETERS) ×2
CATH URETL 5X70 OPEN END (CATHETERS) ×2 IMPLANT
CATH URTH STD 24FR FL 3W 2 (CATHETERS) ×1 IMPLANT
CONTAINER COLLECT MORCELLATR (MISCELLANEOUS) ×1 IMPLANT
DRAPE UTILITY 15X26 TOWEL STRL (DRAPES) IMPLANT
ELECT BIVAP BIPO 22/24 DONUT (ELECTROSURGICAL)
ELECTRD BIVAP BIPO 22/24 DONUT (ELECTROSURGICAL) IMPLANT
FIBER LASER FLEXIVA PULSE 550 (Laser) ×2 IMPLANT
FILTER OVERFLOW MORCELLATOR (FILTER) ×1 IMPLANT
GLOVE SURG UNDER POLY LF SZ7.5 (GLOVE) ×2 IMPLANT
GOWN STRL REUS W/ TWL LRG LVL3 (GOWN DISPOSABLE) ×1 IMPLANT
GOWN STRL REUS W/ TWL XL LVL3 (GOWN DISPOSABLE) ×1 IMPLANT
GOWN STRL REUS W/TWL LRG LVL3 (GOWN DISPOSABLE) ×2
GOWN STRL REUS W/TWL XL LVL3 (GOWN DISPOSABLE) ×2
HOLDER FOLEY CATH W/STRAP (MISCELLANEOUS) ×2 IMPLANT
KIT TURNOVER CYSTO (KITS) ×2 IMPLANT
MANIFOLD NEPTUNE II (INSTRUMENTS) ×2 IMPLANT
MBRN O SEALING YLW 17 FOR INST (MISCELLANEOUS) ×2
MEMBRANE SLNG YLW 17 FOR INST (MISCELLANEOUS) ×1 IMPLANT
MORCELLATOR COLLECT CONTAINER (MISCELLANEOUS) ×2
MORCELLATOR OVERFLOW FILTER (FILTER) ×2
MORCELLATOR ROTATION 4.75 335 (MISCELLANEOUS) ×2 IMPLANT
PACK CYSTO AR (MISCELLANEOUS) ×2 IMPLANT
SET CYSTO W/LG BORE CLAMP LF (SET/KITS/TRAYS/PACK) ×2 IMPLANT
SET IRRIG Y TYPE TUR BLADDER L (SET/KITS/TRAYS/PACK) ×2 IMPLANT
SLEEVE PROTECTION STRL DISP (MISCELLANEOUS) ×4 IMPLANT
SOL .9 NS 3000ML IRR  AL (IV SOLUTION) ×4
SOL .9 NS 3000ML IRR AL (IV SOLUTION) ×4
SOL .9 NS 3000ML IRR UROMATIC (IV SOLUTION) ×4 IMPLANT
SURGILUBE 2OZ TUBE FLIPTOP (MISCELLANEOUS) ×2 IMPLANT
SYR TOOMEY IRRIG 70ML (MISCELLANEOUS) ×2
SYRINGE TOOMEY IRRIG 70ML (MISCELLANEOUS) ×1 IMPLANT
TUBE PUMP MORCELLATOR PIRANHA (TUBING) ×2 IMPLANT
WATER STERILE IRR 1000ML POUR (IV SOLUTION) ×14 IMPLANT

## 2020-12-28 NOTE — H&P (Signed)
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  When I originally saw him in the summer 2021 he had a significantly elevated bladder scan with complaints of urinary frequency and weak stream.  Renal ultrasound at that time showed no hydronephrosis, prostate was enlarged at 150g.  PSA was mildly elevated at 6.2, with reassuring 34% free, and PSA density of 0.04.  At that point, he opted to try Flomax, which improved his PVR slightly to 500 mL, and improve some of his urinary symptoms of weak stream and frequency.   He continues to have a weak stream and urinary frequency.  IPSS score today is 14, with quality of life mixed, and PVR significantly elevated at 801 mL.  I personally reviewed his renal ultrasound from 11/26/2020 that shows a distended bladder with new mild bilateral hydronephrosis.     PMH: Past Medical History:  Diagnosis Date  . H/O adenomatous polyp of colon 02/16/2003  . History of chicken pox     Surgical History: Past Surgical History:  Procedure Laterality Date  . APPENDECTOMY  1974  . COLONOSCOPY    . ROBOT ASSISTED INGUINAL HERNIA REPAIR Left 07/09/2015   Procedure: ROBOT ASSISTED INGUINAL HERNIA REPAIR;  Surgeon: Tiney Rouge III, MD;  Location: [facility] ORS;  Service: General;  Laterality: Left;    Family History: Family History  Problem Relation Age of Onset  . Alcohol abuse Father   . Heart disease Father     Social History:  reports that he has never smoked. He has never used smokeless tobacco. He reports current alcohol use. He reports that he does not use drugs.  Physical Exam: BP (!) 141/95   Pulse 74   Temp 97.8 F (36.6 C) (Oral)   Resp 18   Ht 6\' 1"  (1.854 m)   Wt 91.2 kg   SpO2 99%   BMI 26.52 kg/m    Constitutional:  Alert and  oriented, No acute distress. Cardiovascular: Regular rate and rhythm Respiratory: Clear to auscultation bilaterally GI: Abdomen is soft, nontender, nondistended, no abdominal masses  Laboratory Data: Culture 2/14 no growth  Assessment & Plan:   70 year old male with 150 g prostate, incomplete bladder emptying with PVRs greater than 700 mL, new bilateral mild hydronephrosis, here today for HOLEP.  We discussed the risks and benefits of HoLEP at length.  The procedure requires general anesthesia and takes 2 to 3 hours, and a holmium laser is used to enucleate the prostate and push this tissue into the bladder.  A morcellator is then used to remove this tissue, which is sent for pathology.  The vast majority of patients are able to discharge the same day with a catheter in place for 2 to 3 days, and will follow-up in clinic for a voiding trial.  Approximately 5% of patients will be admitted overnight to monitor the urine, or if they have multiple co-morbidities.  We specifically discussed the risks of bleeding, infection, retrograde ejaculation, temporary urgency and urge incontinence, very low risk of long-term incontinence, pathologic evaluation of prostate tissue and possible detection of prostate cancer or other malignancy, and possible need for additional procedures.  HOLEP today   65, MD 12/28/2020  [facility] [address], [address] [place], [place] [id number]

## 2020-12-28 NOTE — Anesthesia Procedure Notes (Signed)
Procedure Name: Intubation Date/Time: 12/28/2020 7:38 AM Performed by: Jannet Mantis, CRNA Pre-anesthesia Checklist: Patient identified, Emergency Drugs available, Suction available and Patient being monitored Patient Re-evaluated:Patient Re-evaluated prior to induction Oxygen Delivery Method: Circle system utilized Preoxygenation: Pre-oxygenation with 100% oxygen Induction Type: IV induction Ventilation: Mask ventilation without difficulty Laryngoscope Size: McGraph and 4 Grade View: Grade I Tube type: Oral Tube size: 7.0 mm Number of attempts: 1 Airway Equipment and Method: Stylet and Video-laryngoscopy Placement Confirmation: ETT inserted through vocal cords under direct vision,  positive ETCO2,  CO2 detector and breath sounds checked- equal and bilateral Secured at: 21 cm Tube secured with: Tape Dental Injury: Teeth and Oropharynx as per pre-operative assessment  Difficulty Due To: Difficult Airway- due to reduced neck mobility and Difficult Airway- due to anterior larynx Comments: Poor view with traditional DL- easy mask- Grade 1 view with Lorri Frederick

## 2020-12-28 NOTE — Discharge Instructions (Signed)
Indwelling Urinary Catheter Care, Adult An indwelling urinary catheter is a thin tube that is put into your bladder. The tube helps to drain pee (urine) out of your body. The tube goes in through your urethra. Your urethra is where pee comes out of your body. Your pee will come out through the catheter, then it will go into a bag (drainage bag). Take good care of your catheter so it will work well. How to wear your catheter and bag Supplies needed  Sticky tape (adhesive tape) or a leg strap.  Alcohol wipe or soap and water (if you use tape).  A clean towel (if you use tape).  Large overnight bag.  Smaller bag (leg bag). Wearing your catheter Attach your catheter to your leg with tape or a leg strap.  Make sure the catheter is not pulled tight.  If a leg strap gets wet, take it off and put on a dry strap.  If you use tape to hold the bag on your leg: 1. Use an alcohol wipe or soap and water to wash your skin where the tape made it sticky before. 2. Use a clean towel to pat-dry that skin. 3. Use new tape to make the bag stay on your leg. Wearing your bags You should have been given a large overnight bag.  You may wear the overnight bag in the day or night.  Always have the overnight bag lower than your bladder.  Do not let the bag touch the floor.  Before you go to sleep, put a clean plastic bag in a wastebasket. Then hang the overnight bag inside the wastebasket. You should also have a smaller leg bag that fits under your clothes.  Always wear the leg bag below your knee.  Do not wear your leg bag at night. How to care for your skin and catheter Supplies needed  A clean washcloth.  Water and mild soap.  A clean towel. Caring for your skin and catheter  Clean the skin around your catheter every day: 1. Wash your hands with soap and water. 2. Wet a clean washcloth in warm water and mild soap. 3. Clean the skin around your urethra.  If you are male:  Gently  spread the folds of skin around your vagina (labia).  With the washcloth in your other hand, wipe the inner side of your labia on each side. Wipe from front to back.  If you are male:  Pull back any skin that covers the end of your penis (foreskin).  With the washcloth in your other hand, wipe your penis in small circles. Start wiping at the tip of your penis, then move away from the catheter.  Move the foreskin back in place, if needed. 4. With your free hand, hold the catheter close to where it goes into your body.  Keep holding the catheter during cleaning so it does not get pulled out. 5. With the washcloth in your other hand, clean the catheter.  Only wipe downward on the catheter.  Do not wipe upward toward your body. Doing this may push germs into your urethra and cause infection. 6. Use a clean towel to pat-dry the catheter and the skin around it. Make sure to wipe off all soap. 7. Wash your hands with soap and water.  Shower every day. Do not take baths.  Do not use cream, ointment, or lotion on the area where the catheter goes into your body, unless your doctor tells you to.  Do not   use powders, sprays, or lotions on your genital area.  Check your skin around the catheter every day for signs of infection. Check for: ? Redness, swelling, or pain. ? Fluid or blood. ? Warmth. ? Pus or a bad smell.      How to empty the bag Supplies needed  Rubbing alcohol.  Gauze pad or cotton ball.  Tape or a leg strap. Emptying the bag Pour the pee out of your bag when it is ?- full, or at least 2-3 times a day. Do this for your overnight bag and your leg bag. 1. Wash your hands with soap and water. 2. Separate (detach) the bag from your leg. 3. Hold the bag over the toilet or a clean pail. Keep the bag lower than your hips and bladder. This is so the pee (urine) does not go back into the tube. 4. Open the pour spout. It is at the bottom of the bag. 5. Empty the pee into the  toilet or pail. Do not let the pour spout touch any surface. 6. Put rubbing alcohol on a gauze pad or cotton ball. 7. Use the gauze pad or cotton ball to clean the pour spout. 8. Close the pour spout. 9. Attach the bag to your leg with tape or a leg strap. 10. Wash your hands with soap and water. Follow instructions for cleaning the drainage bag:  From the product maker.  As told by your doctor. How to change the bag Supplies needed  Alcohol wipes.  A clean bag.  Tape or a leg strap. Changing the bag Replace your bag when it starts to leak, smell bad, or look dirty. 1. Wash your hands with soap and water. 2. Separate the dirty bag from your leg. 3. Pinch the catheter with your fingers so that pee does not spill out. 4. Separate the catheter tube from the bag tube where these tubes connect (at the connection valve). Do not let the tubes touch any surface. 5. Clean the end of the catheter tube with an alcohol wipe. Use a different alcohol wipe to clean the end of the bag tube. 6. Connect the catheter tube to the tube of the clean bag. 7. Attach the clean bag to your leg with tape or a leg strap. Do not make the bag tight on your leg. 8. Wash your hands with soap and water. General rules  Never pull on your catheter. Never try to take it out. Doing that can hurt you.  Always wash your hands before and after you touch your catheter or bag. Use a mild, fragrance-free soap. If you do not have soap and water, use hand sanitizer.  Always make sure there are no twists or bends (kinks) in the catheter tube.  Always make sure there are no leaks in the catheter or bag.  Drink enough fluid to keep your pee pale yellow.  Do not take baths, swim, or use a hot tub.  If you are male, wipe from front to back after you poop (have a bowel movement).   Contact a doctor if:  Your pee is cloudy.  Your pee smells worse than usual.  Your catheter gets clogged.  Your catheter  leaks.  Your bladder feels full. Get help right away if:  You have redness, swelling, or pain where the catheter goes into your body.  You have fluid, blood, pus, or a bad smell coming from the area where the catheter goes into your body.  Your skin feels   warm where the catheter goes into your body.  You have a fever.  You have pain in your: ? Belly (abdomen). ? Legs. ? Lower back. ? Bladder.  You see blood in the catheter.  Your pee is pink or red.  You feel sick to your stomach (nauseous).  You throw up (vomit).  You have chills.  Your pee is not draining into the bag.  Your catheter gets pulled out. Summary  An indwelling urinary catheter is a thin tube that is placed into the bladder to help drain pee (urine) out of the body.  The catheter is placed into the part of the body that drains pee from the bladder (urethra).  Taking good care of your catheter will keep it working properly and help prevent problems.  Always wash your hands before and after touching your catheter or bag.  Never pull on your catheter or try to take it out. This information is not intended to replace advice given to you by your health care provider. Make sure you discuss any questions you have with your health care provider. Document Revised: 02/04/2019 Document Reviewed: 05/29/2017 Elsevier Patient Education  2021 [employer]   AMBULATORY SURGERY  DISCHARGE INSTRUCTIONS   1) The drugs that you were given will stay in your system until tomorrow so for the next 24 hours you should not:  A) Drive an automobile B) Make any legal decisions C) Drink any alcoholic beverage   2) You may resume regular meals tomorrow.  Today it is better to start with liquids and gradually work up to solid foods.  You may eat anything you prefer, but it is better to start with liquids, then soup and crackers, and gradually work up to solid foods.   3) Please notify your doctor immediately if you  have any unusual bleeding, trouble breathing, redness and pain at the surgery site, drainage, fever, or pain not relieved by medication.    4) Additional Instructions:        Please contact your physician with any problems or Same Day Surgery at [phone], Monday through Friday 6 am to 4 pm, or [facility] number at [phone]. 

## 2020-12-28 NOTE — Anesthesia Postprocedure Evaluation (Signed)
Anesthesia Post Note  Patient: Norman Wilson  Procedure(s) Performed: HOLEP-LASER ENUCLEATION OF THE PROSTATE WITH MORCELLATION (N/A Prostate)  Patient location during evaluation: PACU Anesthesia Type: General Level of consciousness: patient uncooperative Pain management: pain level controlled Vital Signs Assessment: post-procedure vital signs reviewed and stable Respiratory status: spontaneous breathing and respiratory function stable Cardiovascular status: stable Anesthetic complications: no   No complications documented.   Last Vitals:  Vitals:   12/28/20 0915 12/28/20 0930  BP: 118/71 137/83  Pulse: 64 64  Resp: 13 17  Temp: (!) 36.2 C   SpO2: 98% 99%    Last Pain:  Vitals:   12/28/20 0930  TempSrc:   PainSc: 0-No pain                 KEPHART,WILLIAM K

## 2020-12-28 NOTE — Transfer of Care (Signed)
Immediate Anesthesia Transfer of Care Note  Patient: Norman Wilson  Procedure(s) Performed: HOLEP-LASER ENUCLEATION OF THE PROSTATE WITH MORCELLATION (N/A Prostate)  Patient Location: PACU  Anesthesia Type:General  Level of Consciousness: awake  Airway & Oxygen Therapy: Patient Spontanous Breathing  Post-op Assessment: Report given to RN  Post vital signs: stable  Last Vitals:  Vitals Value Taken Time  BP    Temp    Pulse 64 12/28/20 0915  Resp 13 12/28/20 0915  SpO2 98 % 12/28/20 0915  Vitals shown include unvalidated device data.  Last Pain:  Vitals:   12/28/20 0620  TempSrc: Oral  PainSc: 0-No pain         Complications: No complications documented.

## 2020-12-28 NOTE — Anesthesia Preprocedure Evaluation (Signed)
Anesthesia Evaluation  Patient identified by MRN, date of birth, ID band Patient awake    Reviewed: Allergy & Precautions, NPO status , Patient's Chart, lab work & pertinent test results, reviewed documented beta blocker date and time   Airway Mallampati: II  TM Distance: >3 FB     Dental  (+) Chipped   Pulmonary neg pulmonary ROS,           Cardiovascular negative cardio ROS       Neuro/Psych negative neurological ROS  negative psych ROS   GI/Hepatic Neg liver ROS, polyps   Endo/Other  negative endocrine ROS  Renal/GU negative Renal ROS  negative genitourinary   Musculoskeletal negative musculoskeletal ROS (+)   Abdominal   Peds negative pediatric ROS (+)  Hematology negative hematology ROS (+)   Anesthesia Other Findings   Reproductive/Obstetrics                             Anesthesia Physical  Anesthesia Plan  ASA: II  Anesthesia Plan: General   Post-op Pain Management:    Induction: Intravenous  PONV Risk Score and Plan:   Airway Management Planned: Oral ETT  Additional Equipment:   Intra-op Plan:   Post-operative Plan: Extubation in OR  Informed Consent: I have reviewed the patients History and Physical, chart, labs and discussed the procedure including the risks, benefits and alternatives for the proposed anesthesia with the patient or authorized representative who has indicated his/her understanding and acceptance.       Plan Discussed with: CRNA  Anesthesia Plan Comments:         Anesthesia Quick Evaluation

## 2020-12-28 NOTE — Op Note (Signed)
Date of procedure: 12/28/20  Preoperative diagnosis:  1. BPH with incomplete bladder emptying  Postoperative diagnosis:  1. Same  Procedure: 1. HoLEP (Holmium Laser Enucleation of the Prostate)  Surgeon: [name], MD  Anesthesia: General  Complications: None  Intraoperative findings:  1.  Large prostate with massive median lobe, some abnormal appearing prostate tissue within the prostatic urethra 2.  Moderate to severe bladder trabeculations, no bladder tumors 3.  Ureteral orifices and verumontanum intact at conclusion of case  EBL: Minimal  Specimens: Prostate chips  Enucleation time: 36 minutes  Morcellation time: 22 minutes  Intra-op weight: 81 g  Drains: 24 French three-way, 60 cc in balloon  Indication: Norman Wilson is a 70 y.o. patient with long history of BPH symptoms with weak stream and feeling of incomplete emptying, PVRs >745mL in clinic, and new hydronephrosis on renal ultrasound.  Prostate measured 150 g on ultrasound.  After reviewing the management options for treatment, they elected to proceed with the above surgical procedure(s). We have discussed the potential benefits and risks of the procedure, side effects of the proposed treatment, the likelihood of the patient achieving the goals of the procedure, and any potential problems that might occur during the procedure or recuperation.  We specifically discussed the risks of bleeding, infection, hematuria and clot retention, need for additional procedures, possible overnight hospital stay, temporary urgency and incontinence, rare long-term incontinence, and retrograde ejaculation.  Informed consent has been obtained.   Description of procedure:  The patient was taken to the operating room and general anesthesia was induced.  The patient was placed in the dorsal lithotomy position, prepped and draped in the usual sterile fashion, and preoperative antibiotics(Ancef) were administered.  SCDs were placed for  DVT prophylaxis.  A preoperative time-out was performed.   Sissy Hoff sounds were used to gently dilated the urethra up to 36F. The 81 French continuous flow resectoscope was inserted into the urethra using the visual obturator  The prostate was large with a massive median lobe.  There was some abnormal appearing prostate tissue within the prostatic urethra and the contours of the prostate were lobular and abnormal appearing.  The bladder was thoroughly inspected and notable for moderate to severe trabeculations, but no suspicious lesions.  The ureteral orifices were located in orthotopic position.  The laser was set to 2 J and 50 Hz and was used to make a lambda incision just proximal to the verumontanum down to the level of the capsule.  A 5 and 7 o'clock incision were then made down to the level of the capsule from the bladder neck to the verumontanum.  The median lobe was enucleated into the bladder.  The lateral lobes were then incised circumferentially until they were disconnected from the surrounding tissue.  The capsule was examined and laser was used for meticulous hemostasis.    The 64 French resectoscope was then switched out for the 26 French nephroscope and the lobes were morcellated and the tissue sent to pathology.  A 24 French three-way catheter was inserted with the aid of a catheter guide, and 60 cc were placed in the balloon.  Urine was clear.  The catheter irrigated easily with a Toomey syringe.  CBI was initiated. A belladonna suppository was placed.  The patient tolerated the procedure well without any immediate complications and was extubated and transferred to the recovery room in stable condition.  Urine was clear on fast CBI.  Disposition: Stable to PACU  Plan: Wean CBI in PACU, anticipate  discharge home today with void trial in clinic in 2-3 days Follow-up pathology  [name], MD 12/28/2020

## 2020-12-31 ENCOUNTER — Encounter: Payer: Self-pay | Admitting: Physician Assistant

## 2020-12-31 ENCOUNTER — Ambulatory Visit (INDEPENDENT_AMBULATORY_CARE_PROVIDER_SITE_OTHER): Payer: Medicare Other | Admitting: Physician Assistant

## 2020-12-31 ENCOUNTER — Ambulatory Visit: Payer: Medicare Other | Admitting: Physician Assistant

## 2020-12-31 ENCOUNTER — Other Ambulatory Visit: Payer: Self-pay

## 2020-12-31 VITALS — BP 130/84 | HR 76 | Ht 75.0 in | Wt 201.0 lb

## 2020-12-31 DIAGNOSIS — R3914 Feeling of incomplete bladder emptying: Secondary | ICD-10-CM

## 2020-12-31 DIAGNOSIS — N401 Enlarged prostate with lower urinary tract symptoms: Secondary | ICD-10-CM

## 2020-12-31 LAB — SURGICAL PATHOLOGY

## 2020-12-31 NOTE — Patient Instructions (Addendum)
Congratulations on your recent HOLEP procedure! As discussed in clinic today, there are three main side effects that commonly occur after surgery: 1. Burning or pain with urination: This typically resolves within 1 week of surgery. If you are still having significant pain with urination 10 days after surgery, please call our clinic. We may need to check you for a urinary tract infection at that point, though this is rare. 2. Blood in the urine: This may come and go, but typically resolves completely within 3 weeks of surgery. If you are on blood thinners, it may take longer for the bleeding to resolve. As long as your urine remains thin and runny and you are not passing large clots (around the size of your palm), this is a normal postoperative finding. If you start to pass dark red urine or thick, ketchup-like urine, please call our office immediately. 3. Urinary leakage or urgency: This tends to improve with time, with most patients becoming dry within around 3 months of surgery. You may wear absorbant underwear or liners for security during this time. To help you get dry faster, please make sure you are completing your Kegel exercises as instructed, with a set of 10 exercises completed up to three times daily.  Kegel Exercises  Kegel exercises can help strengthen your pelvic floor muscles. The pelvic floor is a group of muscles that support your rectum, small intestine, and bladder. In females, pelvic floor muscles also help support the womb (uterus). These muscles help you control the flow of urine and stool. Kegel exercises are painless and simple, and they do not require any equipment. Your provider may suggest Kegel exercises to:  Improve bladder and bowel control.  Improve sexual response.  Improve weak pelvic floor muscles after surgery to remove the uterus (hysterectomy) or pregnancy (females).  Improve weak pelvic floor muscles after prostate gland removal or surgery (males). Kegel  exercises involve squeezing your pelvic floor muscles, which are the same muscles you squeeze when you try to stop the flow of urine or keep from passing gas. The exercises can be done while sitting, standing, or lying down, but it is best to vary your position. Exercises How to do Kegel exercises: 1. Squeeze your pelvic floor muscles tight. You should feel a tight lift in your rectal area. If you are a male, you should also feel a tightness in your vaginal area. Keep your stomach, buttocks, and legs relaxed. 2. Hold the muscles tight for up to 10 seconds. 3. Breathe normally. 4. Relax your muscles. 5. Repeat as told by your health care provider. Repeat this exercise daily as told by your health care provider. Continue to do this exercise for at least 4-6 weeks, or for as long as told by your health care provider. You may be referred to a physical therapist who can help you learn more about how to do Kegel exercises. Depending on your condition, your health care provider may recommend:  Varying how long you squeeze your muscles.  Doing several sets of exercises every day.  Doing exercises for several weeks.  Making Kegel exercises a part of your regular exercise routine. This information is not intended to replace advice given to you by your health care provider. Make sure you discuss any questions you have with your health care provider. Document Revised: 02/17/2020 Document Reviewed: 06/02/2018 Elsevier Patient Education  2021 Elsevier Inc.   Acute Urinary Retention, Male  Acute urinary retention is when a person cannot pee (urinate) at all, or  can only pee a little. This can come on all of a sudden. If it is not treated, it can lead to kidney problems or other serious problems. What are the causes?  A problem with the tube that drains the bladder (urethra).  Problems with the nerves in the bladder.  Tumors.  Certain medicines.  An infection.  Having trouble pooping  (constipation). What increases the risk? Older men are more at risk because their prostate gland may become larger as they age. Other conditions also can increase risk. These include:  Diseases, such as multiple sclerosis.  Injury to the spinal cord.  Diabetes.  A condition that affects the way the brain works, such as dementia.  Holding back urine due to trauma or because you do not want to use the bathroom. What are the signs or symptoms?  Trouble peeing.  Pain in the lower belly. How is this treated? Treatment for this condition may include:  Medicines.  Placing a thin, germ-free tube (catheter) into the bladder to drain pee out of the body.  Therapy to treat mental health conditions.  Treatment for conditions that may cause this. If needed, you may be treated in the hospital for kidney problems or to manage other problems. Follow these instructions at home: Medicines  Take over-the-counter and prescription medicines only as told by your doctor. Ask your doctor what medicines you should stay away from.  If you were given an antibiotic medicine, take it as told by your doctor. Do not stop taking it, even if you start to feel better. General instructions  Do not smoke or use any products that contain nicotine or tobacco. If you need help quitting, ask your doctor.  Drink enough fluid to keep your pee pale yellow.  If you were sent home with a tube that drains the bladder, take care of it as told by your doctor.  Watch for changes in your symptoms. Tell your doctor about them.  If told, keep track of changes in your blood pressure at home. Tell your doctor about them.  Keep all follow-up visits. Contact a doctor if:  You have spasms in your bladder that you cannot stop.  You leak pee when you have spasms. Get help right away if:  You have chills or a fever.  You have blood in your pee.  You have a tube that drains pee from the bladder and these things  happen: ? The tube stops draining pee. ? The tube falls out. Summary  Acute urinary retention is when you cannot pee at all or you pee too little.  If this condition is not treated, it can lead to kidney problems or other serious problems.  If you were sent home with a tube (catheter) that drains the bladder, take care of it as told by your doctor.  Watch for changes in your symptoms. Tell your doctor about them. This information is not intended to replace advice given to you by your health care provider. Make sure you discuss any questions you have with your health care provider. Document Revised: 07/04/2020 Document Reviewed: 07/04/2020 Elsevier Patient Education  2021 [employer].

## 2020-12-31 NOTE — Progress Notes (Signed)
Fill and Pull Catheter Removal  Patient is present today for a catheter removal.  Patient was cleaned and prepped in a sterile fashion of sterile water was instilled into the bladder when the patient felt the urge to urinate. 54ml of water was then drained from the balloon.  A 24FR three-way foley cath was removed from the bladder no complications were noted.  Patient was then given some time to void on their own.  Patient can void on their own after some time.  Patient tolerated well.  Performed by: Carman Ching, PA-C   Follow up/ Additional notes: Patient declined afternoon follow-up PVR today, stating he lives in [place] and does not wish to make the drive.  I explained that we use the afternoon visit to ensure appropriate bladder emptying by using a bladder scanner and that patients can empty the bladder poorly without awareness of this.  Patient expressed understanding and still wishes to skip his afternoon visit.  We discussed normal postoperative findings today including dysuria, gross hematuria, and urinary leakage.  I counseled him to wear absorbent underwear pads for security.  I counseled him to start Kegel exercises 3x10 sets daily and provided verbal and written instructions today.  Patient expressed understanding.  Surgical pathology not yet resulted, will defer to Dr. Richardo Hanks to inform patient of results.

## 2021-01-01 ENCOUNTER — Telehealth: Payer: Self-pay

## 2021-01-01 NOTE — Telephone Encounter (Signed)
Pt returns call, informed him of the information below. Pt gave verbal understanding.  

## 2021-01-01 NOTE — Telephone Encounter (Signed)
-----   Message from Sondra Come, MD sent at 01/01/2021  8:22 AM EST ----- Great news, only benign prostate tissue on HOLEP pathology, follow up as scheduled  [name], MD 01/01/2021

## 2021-01-01 NOTE — Telephone Encounter (Signed)
Called pt, no answer. Unable to leave voicemail as mailbox is not set up. 1st attempt.

## 2021-01-01 NOTE — Telephone Encounter (Signed)
Called pt no answer. 2nd attempt.  °

## 2021-01-16 ENCOUNTER — Other Ambulatory Visit: Payer: Medicare Other | Admitting: Urology

## 2021-03-07 ENCOUNTER — Ambulatory Visit: Payer: Medicare Other | Admitting: Urology

## 2021-04-02 ENCOUNTER — Ambulatory Visit (INDEPENDENT_AMBULATORY_CARE_PROVIDER_SITE_OTHER): Payer: Medicare Other | Admitting: Urology

## 2021-04-02 ENCOUNTER — Encounter: Payer: Self-pay | Admitting: Urology

## 2021-04-02 ENCOUNTER — Other Ambulatory Visit: Payer: Self-pay

## 2021-04-02 VITALS — BP 155/91 | HR 70 | Ht 75.0 in | Wt 202.0 lb

## 2021-04-02 DIAGNOSIS — N401 Enlarged prostate with lower urinary tract symptoms: Secondary | ICD-10-CM | POA: Diagnosis not present

## 2021-04-02 DIAGNOSIS — R3914 Feeling of incomplete bladder emptying: Secondary | ICD-10-CM | POA: Diagnosis not present

## 2021-04-02 LAB — BLADDER SCAN AMB NON-IMAGING

## 2021-04-02 NOTE — Progress Notes (Signed)
   04/02/2021 8:56 AM   Norman Wilson 10-11-1951 [id number]  Reason for visit: Follow up BPH and incomplete emptying  HPI: 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 When I originally saw him in the summer 2021 he had a significantly elevated bladder scan with complaints of urinary frequency and weak stream. Renal ultrasound at that time showed no hydronephrosis, prostate was enlarged at 150g. PSA was mildly elevated at 6.2, with reassuring 34% free, and PSA density of 0.04.  At that point, he opted to try Flomax, which improved his PVR slightly to 500 mL, and improve some of his urinary symptoms of weak stream and frequency.   He continued to have weak stream and urinary frequency with incomplete emptying and PVR of > 800 mL, and a renal ultrasound showed new mild hydronephrosis bilaterally secondary to incomplete emptying.  He was amenable to surgery at that point, and underwent an uncomplicated HOLEP on 12/28/2020 with removal of 80 g  benign prostate tissue.  He is done extremely well since that time.  He is urinating with a strong stream, and PVR is normal at 94 mL today.  He is not having any hematuria, dysuria, or incontinence.  He has no complaints and is delighted with his urinary symptoms at this time.    Return precautions discussed extensively, RTC 9 months with PVR, if doing well at that time can likely follow-up as needed   Sondra Come, MD  [facility] [address] [place], [place] [address] [phone]

## 2021-06-22 IMAGING — US US RENAL
1 series · 14 of 25 positions shown · non-contrast
Comparison: Renal ultrasound 05/03/2020

CLINICAL DATA: BPH with incomplete bladder emptying.

EXAM:
RENAL / URINARY TRACT ULTRASOUND COMPLETE

[Series 1: us renal · 0.25mm/px · 14 of 68 slices shown]
[im 1/68]
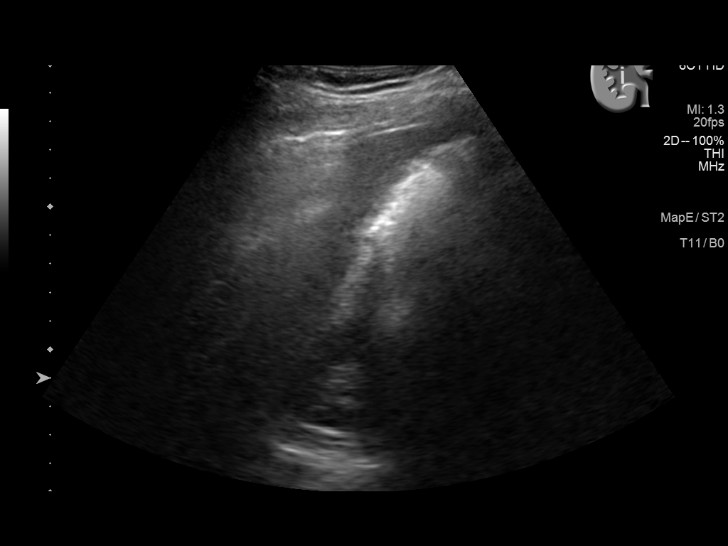
[im 6/68]
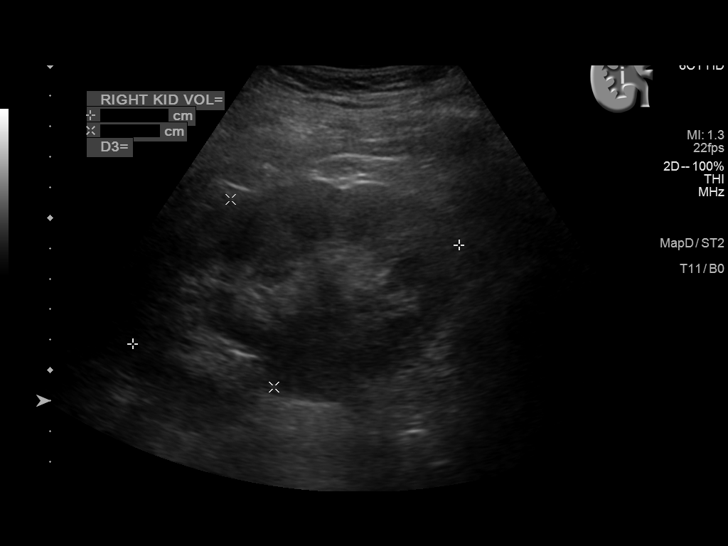
[im 12/68]
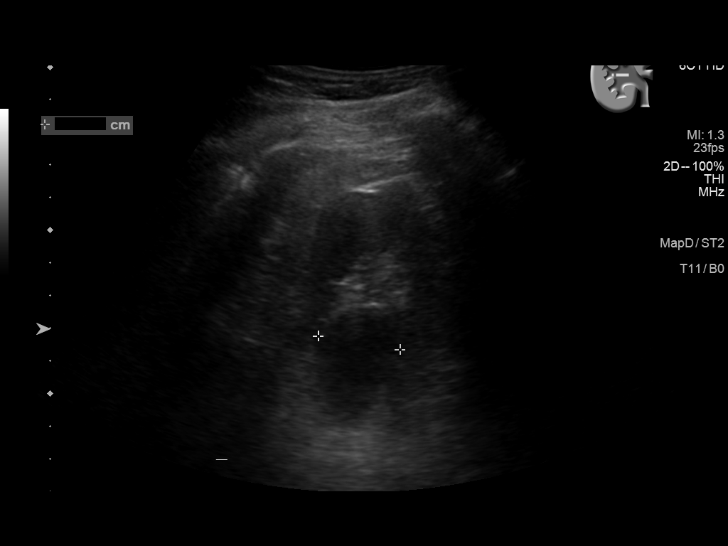
[im 17/68]
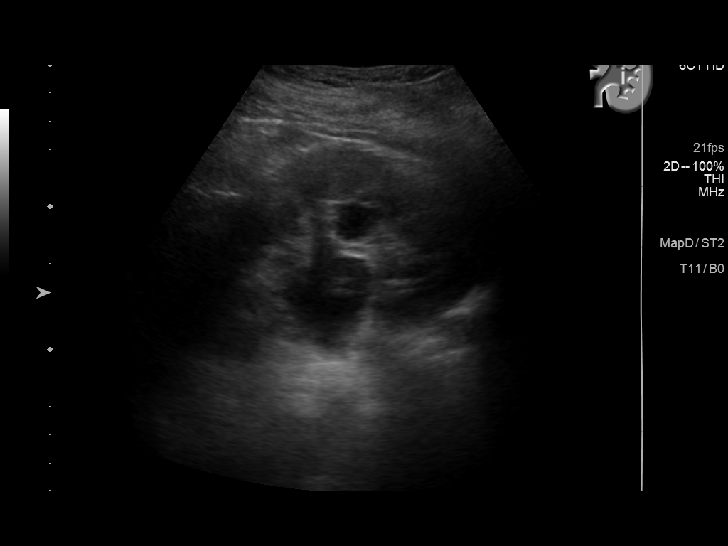
[im 23/68]
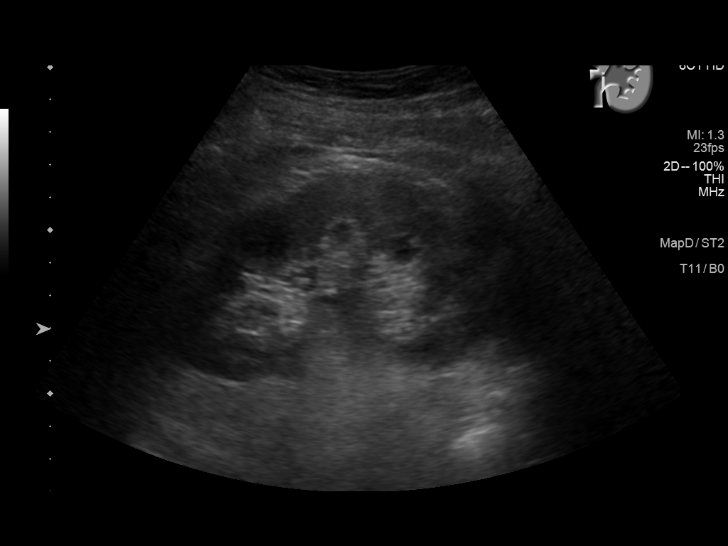
[im 26/68]
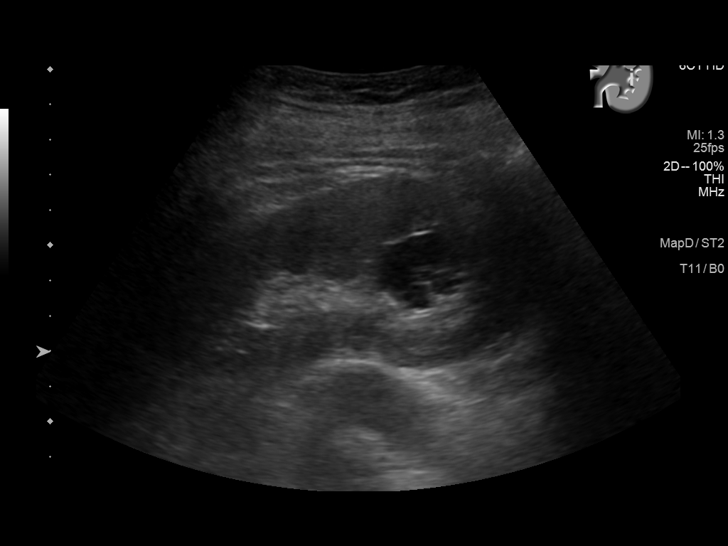
[im 31/68]
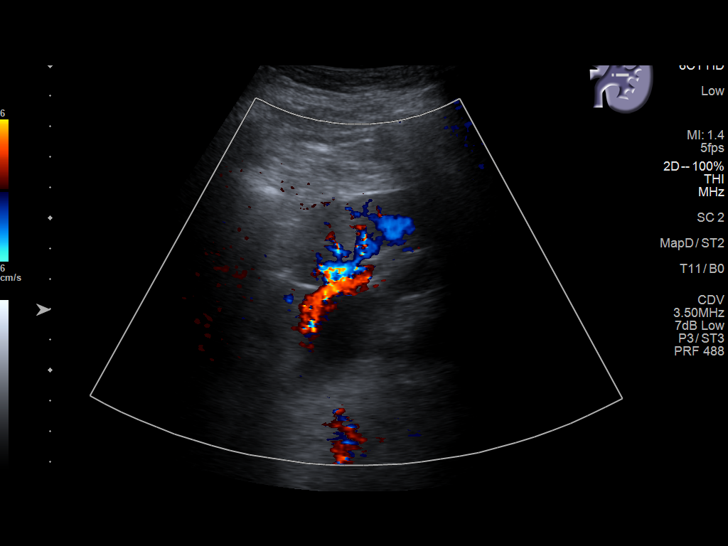
[im 37/68]
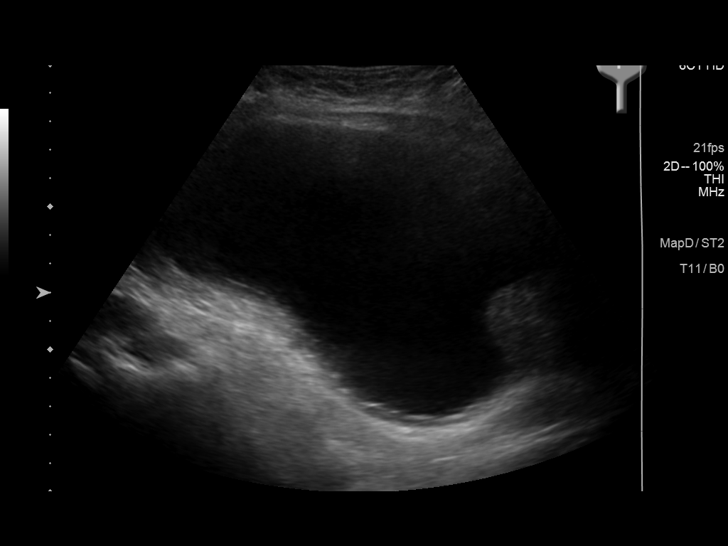
[im 42/68]
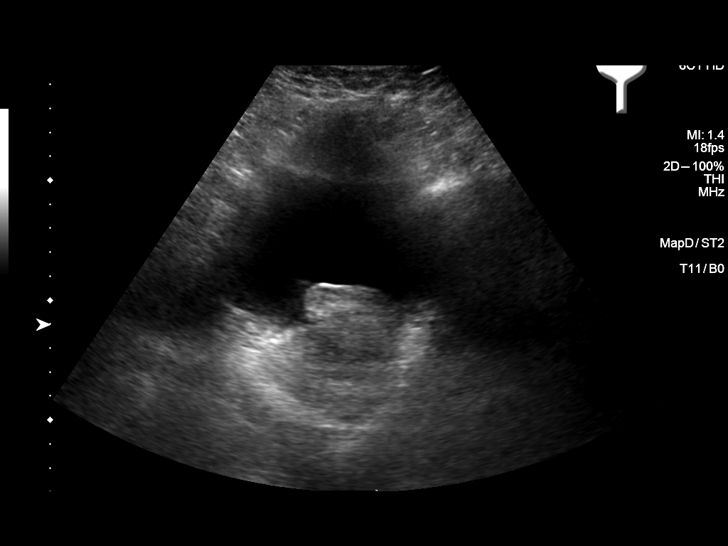
[im 45/68]
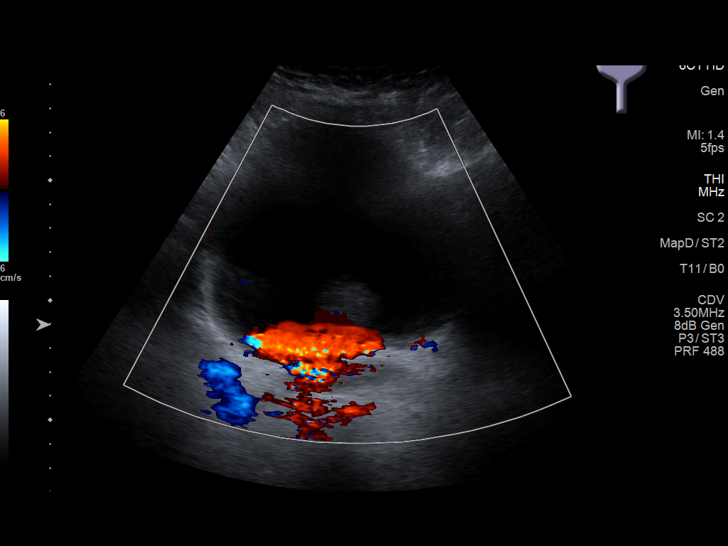
[im 51/68]
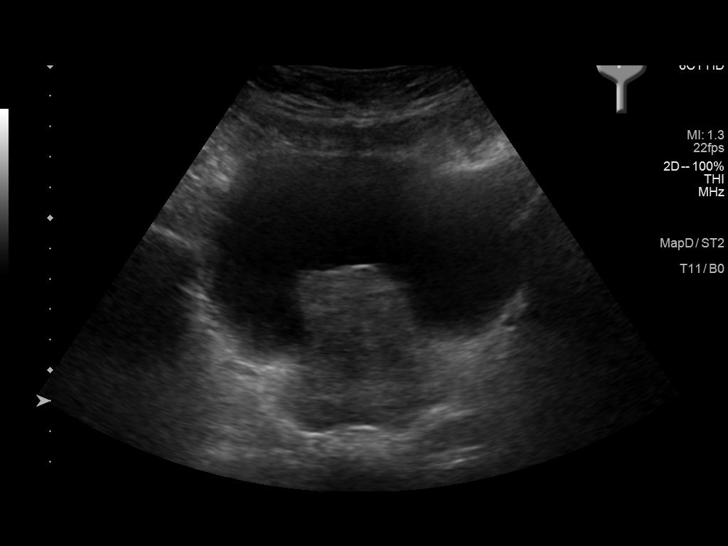
[im 56/68]
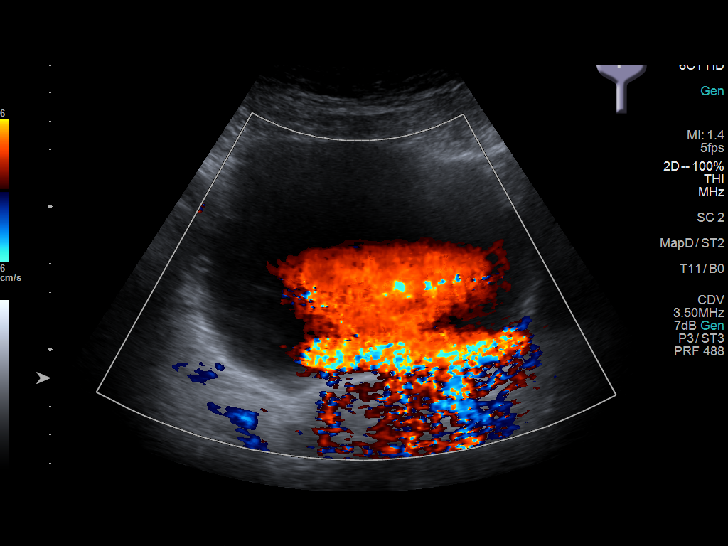
[im 62/68]
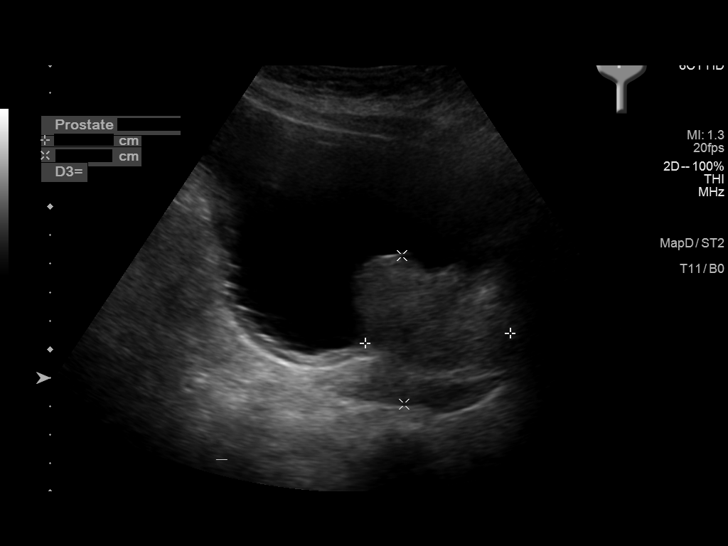
[im 68/68]
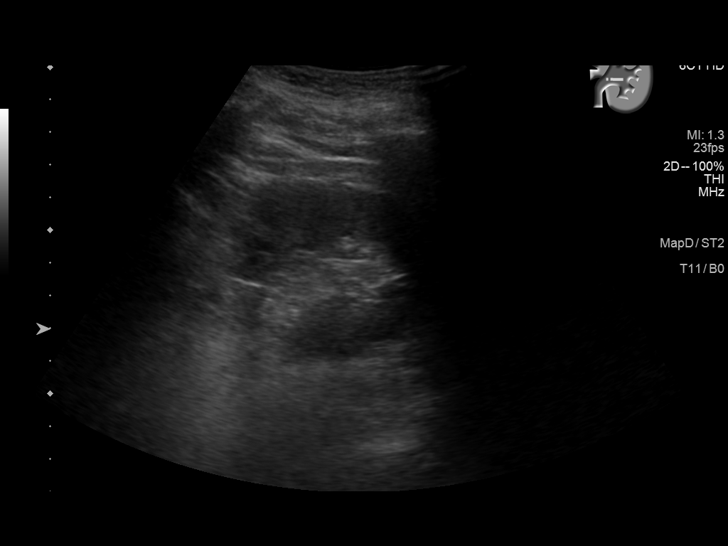

[14 of 25 positions shown; findings below may reference images not displayed]

FINDINGS: Right Kidney:

Renal measurements: 11.2 x 6.3 x 5.4 cm = volume: 201.1 mL. Normal
renal cortical thickness for age. Slight increased echogenicity. No
renal lesions. Mild hydronephrosis.

Left Kidney:

Renal measurements: 10.3 x 5.8 x 5.6 cm = volume: 144.3 mL. Normal
renal cortical thickness. Mild increased echogenicity. There is a
2.5 x 2.5 x 1.8 cm parapelvic cyst. Mild hydronephrosis.

Bladder:

No mass is identified. There is mild prostate gland enlargement with
median lobe hypertrophy impressing on the base of the bladder.

Prevoid volume is 2328 cubic cm postvoid is 831 cubic cm.

Bilateral ureteral jets are noted.

Other:

The mild right-sided hydronephrosis did not change after voiding.
The left-sided hydronephrosis resolved after voiding.
IMPRESSION: 1. Mild bilateral hydronephrosis.
2. Mild increase echogenicity of both kidneys.
3. 2.5 cm parapelvic cyst associated with the left kidney.
4. Moderate postvoid residual again demonstrated.

## 2021-12-27 ENCOUNTER — Encounter: Payer: Self-pay | Admitting: Family Medicine

## 2021-12-31 ENCOUNTER — Ambulatory Visit: Payer: Medicare Other | Admitting: Urology

## 2022-01-09 LAB — COLOGUARD: COLOGUARD: NEGATIVE

## 2022-06-02 ENCOUNTER — Encounter: Payer: Self-pay | Admitting: Family Medicine

## 2022-06-02 ENCOUNTER — Ambulatory Visit (INDEPENDENT_AMBULATORY_CARE_PROVIDER_SITE_OTHER): Payer: Medicare Other | Admitting: Family Medicine

## 2022-06-02 VITALS — BP 136/81 | HR 67 | Temp 98.6°F | Resp 16 | Ht 76.0 in | Wt 188.4 lb

## 2022-06-02 DIAGNOSIS — Z13228 Encounter for screening for other metabolic disorders: Secondary | ICD-10-CM

## 2022-06-02 DIAGNOSIS — Z125 Encounter for screening for malignant neoplasm of prostate: Secondary | ICD-10-CM

## 2022-06-02 DIAGNOSIS — Z136 Encounter for screening for cardiovascular disorders: Secondary | ICD-10-CM

## 2022-06-02 DIAGNOSIS — Z0000 Encounter for general adult medical examination without abnormal findings: Secondary | ICD-10-CM

## 2022-06-02 DIAGNOSIS — G8929 Other chronic pain: Secondary | ICD-10-CM

## 2022-06-02 DIAGNOSIS — M25561 Pain in right knee: Secondary | ICD-10-CM

## 2022-06-02 DIAGNOSIS — M25562 Pain in left knee: Secondary | ICD-10-CM

## 2022-06-02 NOTE — Progress Notes (Signed)
I,Jana Robinson,acting as a scribe for Mila Merry, MD.,have documented all relevant documentation on the behalf of Mila Merry, MD,as directed by  Mila Merry, MD while in the presence of Mila Merry, MD.   Complete Physical Exam      Patient: Norman Wilson, Male    DOB: 1951-06-06, 71 y.o.   MRN: [id number] Visit Date: 06/02/2022  Today's Provider: Mila Merry, MD   Chief Complaint  Patient presents with   Annual Exam   Subjective    Norman Wilson is a 71 y.o. male who presents today for his complete physical examination. He feels well, only complaint today is bilateral knee pain which sometime affects his physical activity. He is thinking about getting in with orthopedics to evaluate for knee replacement. Otherwise doing well, exercises regularly. Eats a healthy diet. 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    Medications: Outpatient Medications Prior to Visit  Medication Sig   aspirin 81 MG tablet Take 81 mg by mouth daily.   naproxen sodium (ANAPROX) 220 MG tablet Take 440 mg by mouth every morning.   No facility-administered medications prior to visit.    Allergies  Allergen Reactions   Latex Swelling    Patient Care Team: Malva Limes, MD as PCP - General (Family Medicine)  Review of Systems  Constitutional: Negative.   HENT: Negative.    Eyes: Negative.   Respiratory: Negative.    Cardiovascular: Negative.   Gastrointestinal: Negative.   Endocrine: Negative.   Genitourinary: Negative.   Musculoskeletal:        Knee pain  Skin: Negative.   Allergic/Immunologic: Negative.   Neurological: Negative.   Hematological: Negative.   Psychiatric/Behavioral: Negative.    All other systems reviewed and are negative.       Objective    Vitals: BP 136/81 (BP Location: Left Arm, Patient Position: Sitting, Cuff Size: Normal)   Pulse 67   Temp 98.6 F (37 C) (Oral)   Resp 16   Ht 6\' 4"  (1.93 m)   Wt 188 lb 6.4 oz (85.5 kg)   SpO2 96%   BMI 22.93  kg/m     General Appearance:    Well developed, well nourished male. Alert, cooperative, in no acute distress, appears stated age  Head:    Normocephalic, without obvious abnormality, atraumatic  Eyes:    PERRL, conjunctiva/corneas clear, EOM's intact, fundi    benign, both eyes       Ears:    Normal TM's and external ear canals, both ears  Nose:   Nares normal, septum midline, mucosa normal, no drainage   or sinus tenderness  Throat:   Lips, mucosa, and tongue normal; teeth and gums normal  Neck:   Supple, symmetrical, trachea midline, no adenopathy;       thyroid:  No enlargement/tenderness/nodules; no carotid   bruit or JVD  Back:     Symmetric, no curvature, ROM normal, no CVA tenderness  Lungs:     Clear to auscultation bilaterally, respirations unlabored  Chest wall:    No tenderness or deformity  Heart:    Normal heart rate. Normal rhythm. No murmurs, rubs, or gallops.  S1 and S2 normal  Abdomen:     Soft, non-tender, bowel sounds active all four quadrants,    no masses, no organomegaly  Genitalia:    deferred  Rectal:    deferred  Extremities:   All extremities are intact. No cyanosis or edema  Pulses:   2+ and symmetric all extremities  Skin:   Skin color, texture, turgor normal, no rashes or lesions  Lymph nodes:   Cervical, supraclavicular, and axillary nodes normal  Neurologic:   CNII-XII intact. Normal strength, sensation and reflexes      throughout      Assessment & Plan     Annual wellness visit done today including the all of the following: Reviewed patient's Family Medical History Reviewed and updated list of patient's medical providers Assessment of cognitive impairment was done Assessed patient's functional ability Established a written schedule for health screening services Health Risk Assessent Completed and Reviewed  Exercise Activities and Dietary recommendations  Goals   None     Immunization History  Administered Date(s) Administered    Zoster, Live 12/19/2013    Health Maintenance  Topic Date Due   COVID-19 Vaccine (1) Never done   Hepatitis C Screening  Never done   Zoster Vaccines- Shingrix (1 of 2) Never done   Pneumonia Vaccine 56+ Years old (1 - PCV) Never done   Fecal DNA (Cologuard)  01/03/2025   HPV VACCINES  Aged Out   COLONOSCOPY (Pts 45-55yrs Insurance coverage will need to be confirmed)  Discontinued     Discussed health benefits of physical activity, and encouraged him to engage in regular exercise appropriate for his age and condition.     Generally doing well. Recommended Prevnar which he declined.  - CBC - Comprehensive metabolic panel - Lipid panel  2. Chronic pain of both knees Likely OA. Starting to have some impact on ambulation. Advised he can call for orthopedic referral when he is ready.   3. Prostate cancer screening  - PSA Total (Reflex To Free)  4. Encounter for special screening examination for cardiovascular disorder  - Lipid panel  5. Screening for metabolic disorder  - Comprehensive metabolic panel     The entirety of the information documented in the History of Present Illness, Review of Systems and Physical Exam were personally obtained by me. Portions of this information were initially documented by the CMA and reviewed by me for thoroughness and accuracy.     Mila Merry, MD  [facility] [phone] (phone) [phone] (fax)  [facility]

## 2022-06-02 NOTE — Patient Instructions (Addendum)
Please review the attached list of medications and notify my office if there are any errors.   Please bring all of your medications to every appointment so we can make sure that our medication list is the same as yours.   I recommend that you get the Prevnar 20 vaccine to protect yourself from certain dangerous strains of pneumonia. You can get Prevnar 20 at your pharmacy, or call our office at [phone] at your earliest convenience to schedule this vaccine.   Please contact your eyecare professional to schedule a routine eye exam

## 2022-06-02 NOTE — Progress Notes (Signed)
Annual Wellness Visit     Patient: Norman Wilson, Male    DOB: 22-May-1951, 71 y.o.   MRN: [id number] Visit Date: 06/02/2022  Today's Provider: Mila Merry, MD    Subjective    Norman Wilson is a 71 y.o. male who presents today for his Annual Wellness Visit.   Medications: Outpatient Medications Prior to Visit  Medication Sig   aspirin 81 MG tablet Take 81 mg by mouth daily.   naproxen sodium (ANAPROX) 220 MG tablet Take 440 mg by mouth every morning.   No facility-administered medications prior to visit.    Allergies  Allergen Reactions   Latex Swelling    Patient Care Team: Malva Limes, MD as PCP - General (Family Medicine)        Objective     Most recent functional status assessment:    06/02/2022    9:04 AM  In your present state of health, do you have any difficulty performing the following activities:  Hearing? 0  Vision? 0  Difficulty concentrating or making decisions? 0  Walking or climbing stairs? 0  Dressing or bathing? 0  Doing errands, shopping? 0   Most recent fall risk assessment:    06/02/2022    9:04 AM  Fall Risk   Falls in the past year? 0  Number falls in past yr: 0  Injury with Fall? 0  Follow up Falls evaluation completed    Most recent depression screenings:    06/02/2022    9:04 AM 03/20/2020    9:24 AM  PHQ 2/9 Scores  PHQ - 2 Score 0 0  PHQ- 9 Score 0    Most recent cognitive screening:     No data to display         Most recent Audit-C alcohol use screening    06/02/2022    9:04 AM  Alcohol Use Disorder Test (AUDIT)  1. How often do you have a drink containing alcohol? 0  3. How often do you have six or more drinks on one occasion? 0   A score of 3 or more in women, and 4 or more in men indicates increased risk for alcohol abuse, EXCEPT if all of the points are from question 1   No results found for any visits on 06/02/22.  Assessment & Plan     Annual wellness visit done today including the all  of the following: Reviewed patient's Family Medical History Reviewed and updated list of patient's medical providers Assessment of cognitive impairment was done Assessed patient's functional ability Established a written schedule for health screening services Health Risk Assessent Completed and Reviewed  Exercise Activities and Dietary recommendations  Goals   None     Immunization History  Administered Date(s) Administered   Zoster, Live 12/19/2013    Health Maintenance  Topic Date Due   COVID-19 Vaccine (1) Never done   Hepatitis C Screening  Never done   Zoster Vaccines- Shingrix (1 of 2) Never done   Pneumonia Vaccine 89+ Years old (1 - PCV) Never done   Fecal DNA (Cologuard)  01/03/2025   HPV VACCINES  Aged Out   COLONOSCOPY (Pts 45-65yrs Insurance coverage will need to be confirmed)  Discontinued     Discussed health benefits of physical activity, and encouraged him to engage in regular exercise appropriate for his age and condition.        The entirety of the information documented in the History of Present Illness, Review of  Systems and Physical Exam were personally obtained by me. Portions of this information were initially documented by the CMA and reviewed by me for thoroughness and accuracy.     Mila Merry, MD  [facility] [phone] (phone) [phone] (fax)  [facility]

## 2022-06-03 LAB — PSA TOTAL (REFLEX TO FREE): Prostate Specific Ag, Serum: 2 ng/mL (ref 0.0–4.0)

## 2022-06-03 LAB — CBC
Hematocrit: 41.9 % (ref 37.5–51.0)
Hemoglobin: 14.1 g/dL (ref 13.0–17.7)
MCH: 31.4 pg (ref 26.6–33.0)
MCHC: 33.7 g/dL (ref 31.5–35.7)
MCV: 93 fL (ref 79–97)
Platelets: 281 10*3/uL (ref 150–450)
RBC: 4.49 x10E6/uL (ref 4.14–5.80)
RDW: 13.1 % (ref 11.6–15.4)
WBC: 9.6 10*3/uL (ref 3.4–10.8)

## 2022-06-03 LAB — LIPID PANEL
Chol/HDL Ratio: 2.9 ratio (ref 0.0–5.0)
Cholesterol, Total: 168 mg/dL (ref 100–199)
HDL: 57 mg/dL (ref >39)
LDL Chol Calc (NIH): 98 mg/dL (ref 0–99)
Triglycerides: 69 mg/dL (ref 0–149)
VLDL Cholesterol Cal: 13 mg/dL (ref 5–40)

## 2022-06-03 LAB — COMPREHENSIVE METABOLIC PANEL
ALT: 11 IU/L (ref 0–44)
AST: 19 IU/L (ref 0–40)
Albumin/Globulin Ratio: 1.7 (ref 1.2–2.2)
Albumin: 4.3 g/dL (ref 3.8–4.8)
Alkaline Phosphatase: 72 IU/L (ref 44–121)
BUN/Creatinine Ratio: 15 (ref 10–24)
BUN: 13 mg/dL (ref 8–27)
Bilirubin Total: 0.8 mg/dL (ref 0.0–1.2)
CO2: 25 mmol/L (ref 20–29)
Calcium: 9.3 mg/dL (ref 8.6–10.2)
Chloride: 103 mmol/L (ref 96–106)
Creatinine, Ser: 0.87 mg/dL (ref 0.76–1.27)
Globulin, Total: 2.6 g/dL (ref 1.5–4.5)
Glucose: 88 mg/dL (ref 70–99)
Potassium: 4.4 mmol/L (ref 3.5–5.2)
Sodium: 141 mmol/L (ref 134–144)
Total Protein: 6.9 g/dL (ref 6.0–8.5)
eGFR: 92 mL/min/{1.73_m2} (ref >59)

## 2023-07-13 ENCOUNTER — Telehealth: Payer: Self-pay

## 2023-07-13 NOTE — Telephone Encounter (Signed)
Copied from CRM [id number]. Topic: Medicare AWV >> Jul 13, 2023  3:27 PM Lennox Pippins wrote: Patient called and stated he was returning Brenda's call, he stated did not do annual wellness visit last year, he stated he does not do annual wellness visits and he only ever has had physicals and is not interested in wellness visits. Scheduled patient for physical on 11/15/2022 per patients request.

## 2023-08-13 ENCOUNTER — Telehealth: Payer: Self-pay | Admitting: Family Medicine

## 2023-08-13 NOTE — Telephone Encounter (Signed)
I called patient to schedule his Annual Wellness Visit.  Patient said he doesn't do AWVs and would see Dr. Sherrie Mustache for his physical in January.

## 2023-11-16 ENCOUNTER — Encounter: Payer: Self-pay | Admitting: Family Medicine

## 2023-11-16 ENCOUNTER — Ambulatory Visit (INDEPENDENT_AMBULATORY_CARE_PROVIDER_SITE_OTHER): Payer: Medicare Other | Admitting: Family Medicine

## 2023-11-16 VITALS — BP 146/82 | HR 54 | Temp 97.6°F | Ht 76.0 in | Wt 194.8 lb

## 2023-11-16 DIAGNOSIS — I491 Atrial premature depolarization: Secondary | ICD-10-CM | POA: Diagnosis not present

## 2023-11-16 DIAGNOSIS — G8929 Other chronic pain: Secondary | ICD-10-CM

## 2023-11-16 DIAGNOSIS — Z0000 Encounter for general adult medical examination without abnormal findings: Secondary | ICD-10-CM | POA: Diagnosis not present

## 2023-11-16 DIAGNOSIS — M25561 Pain in right knee: Secondary | ICD-10-CM | POA: Diagnosis not present

## 2023-11-16 DIAGNOSIS — M25562 Pain in left knee: Secondary | ICD-10-CM

## 2023-11-16 NOTE — Patient Instructions (Addendum)
Please review the attached list of medications and notify my office if there are any errors.   Please bring all of your medications to every appointment so we can make sure that our medication list is the same as yours.   I recommend Dr. Odis Luster or Dr. Martha Clan at [facility] in [place] for treatment of knee pain. [phone]

## 2023-11-16 NOTE — Progress Notes (Signed)
Complete physical exam   Patient: Norman Wilson   DOB: [birth year]   73 y.o. Male  MRN: [id number] Visit Date: 11/16/2023  Today's healthcare provider: Mila Merry, MD   Chief Complaint  Patient presents with   Annual Exam   Subjective    Norman Wilson is a 74 y.o. male who presents today for a complete physical exam.  He reports consuming a  regular  diet.  He generally feels well. He reports sleeping well. The patient is considering knee surgery in the near future and is exploring options for orthopedic surgeons and physical therapy locations. 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 No recent changes in moles or skin lesions have been noted. The patient denies any recent breathing difficulties or changes in bowel habits.   His only complaint today long standing but worsening bilateral knee pain.    He has eye doctor but has been over a year since last exam.   Lab Results  Component Value Date   CHOL 168 06/02/2022   HDL 57 06/02/2022   LDLCALC 98 06/02/2022   TRIG 69 06/02/2022   CHOLHDL 2.9 06/02/2022   Lab Results  Component Value Date   NA 141 06/02/2022   K 4.4 06/02/2022   CREATININE 0.87 06/02/2022   EGFR 92 06/02/2022   GLUCOSE 88 06/02/2022    Past Medical History:  Diagnosis Date   H/O adenomatous polyp of colon 02/16/2003   History of chicken pox    Past Surgical History:  Procedure Laterality Date   APPENDECTOMY  1974   COLONOSCOPY     HOLEP-LASER ENUCLEATION OF THE PROSTATE WITH MORCELLATION N/A 12/28/2020   Procedure: HOLEP-LASER ENUCLEATION OF THE PROSTATE WITH MORCELLATION;  Surgeon: Sondra Come, MD;  Location: [facility] ORS;  Service: Urology;  Laterality: N/A;   ROBOT ASSISTED INGUINAL HERNIA REPAIR Left 07/09/2015   Procedure: ROBOT ASSISTED INGUINAL HERNIA REPAIR;  Surgeon: Tiney Rouge III, MD;  Location: [facility] ORS;  Service: General;  Laterality: Left;   Social History   Socioeconomic  History   Marital status: Married    Spouse name: Not on file   Number of children: Not on file   Years of education: Some Coll   Highest education level: Not on file  Occupational History   Occupation: Full-Time  Tobacco Use   Smoking status: Never   Smokeless tobacco: Never  Vaping Use   Vaping status: Never Used  Substance and Sexual Activity   Alcohol use: Yes    Alcohol/week: 0.0 standard drinks of alcohol    Comment: once a month   Drug use: No   Sexual activity: Not Currently  Other Topics Concern   Not on file  Social History Narrative   Not on file   Social Drivers of Health   Financial Resource Strain: Not on file  Food Insecurity: Not on file  Transportation Needs: Not on file  Physical Activity: Not on file  Stress: Not on file  Social Connections: Not on file  Intimate Partner Violence: Not on file   Family Status  Relation Name Status   Father  Deceased at age 73       MI   Mother  Deceased at age 25   Sister  Alive  No partnership data on file   Family History  Problem Relation Age of Onset   Alcohol abuse Father    Heart disease Father    Allergies  Allergen Reactions   Latex Swelling    Patient Care Team: Malva Limes, MD as PCP - General (Family Medicine)   Medications: Outpatient Medications Prior to Visit  Medication Sig   naproxen sodium (ANAPROX) 220 MG tablet Take 440 mg by mouth every morning.   aspirin 81 MG tablet Take 81 mg by mouth daily. (Patient not taking: Reported on 11/16/2023)   No facility-administered medications prior to visit.    Review of Systems  Constitutional:  Negative for chills, diaphoresis and fever.  HENT:  Negative for congestion, ear discharge, ear pain, hearing loss, nosebleeds, sore throat and tinnitus.   Eyes:  Negative for photophobia, pain, discharge and redness.  Respiratory:  Negative for cough, shortness of breath, wheezing and stridor.   Cardiovascular:  Negative for chest pain,  palpitations and leg swelling.  Gastrointestinal:  Negative for abdominal pain, blood in stool, constipation, diarrhea, nausea and vomiting.  Endocrine: Negative for polydipsia.  Genitourinary:  Negative for dysuria, flank pain, frequency, hematuria and urgency.  Musculoskeletal:  Negative for back pain, myalgias and neck pain.  Skin:  Negative for rash.  Allergic/Immunologic: Negative for environmental allergies.  Neurological:  Negative for dizziness, tremors, seizures, weakness and headaches.  Hematological:  Does not bruise/bleed easily.  Psychiatric/Behavioral:  Negative for hallucinations and suicidal ideas. The patient is not nervous/anxious.       Objective    BP (!) 146/82   Pulse (!) 54   Temp 97.6 F (36.4 C) (Oral)   Ht 6\' 4"  (1.93 m)   Wt 194 lb 12.8 oz (88.4 kg)   BMI 23.71 kg/m    Physical Exam   General Appearance:    Well developed, well nourished male. Alert, cooperative, in no acute distress, appears stated age  Head:    Normocephalic, without obvious abnormality, atraumatic  Eyes:    PERRL, conjunctiva/corneas clear, EOM's intact, fundi    benign, both eyes       Ears:    Normal TM's and external ear canals, both ears  Nose:   Nares normal, septum midline, mucosa normal, no drainage   or sinus tenderness  Throat:   Lips, mucosa, and tongue normal; teeth and gums normal  Neck:   Supple, symmetrical, trachea midline, no adenopathy;       thyroid:  No enlargement/tenderness/nodules; no carotid   bruit or JVD  Back:     Symmetric, no curvature, ROM normal, no CVA tenderness  Lungs:     Clear to auscultation bilaterally, respirations unlabored  Chest wall:    No tenderness or deformity  Heart:    Bradycardic. Frequent premature beats. No murmurs, rubs, or gallops.  S1 and S2 normal  Abdomen:     Soft, non-tender, bowel sounds active all four quadrants,    no masses, no organomegaly  Genitalia:    deferred  Rectal:    deferred  Extremities:   All  extremities are intact. No cyanosis or edema  Pulses:   2+ and symmetric all extremities  Skin:   Skin color, texture, turgor normal, no rashes or lesions  Lymph nodes:   Cervical, supraclavicular, and axillary nodes normal  Neurologic:   CNII-XII intact. Normal strength, sensation and reflexes      throughout     Last depression screening scores    11/16/2023    9:52 AM 06/02/2022    9:04 AM 03/20/2020    9:24 AM  PHQ 2/9 Scores  PHQ - 2 Score 0 0 0  PHQ- 9 Score  0 0    Last fall risk screening    11/16/2023    9:38 AM  Fall Risk   Falls in the past year? 0  Number falls in past yr: 0  Injury with Fall? 0  Risk for fall due to : No Fall Risks  Follow up Falls prevention discussed;Education provided;Falls evaluation completed   Last Audit-C alcohol use screening    06/02/2022    9:04 AM  Alcohol Use Disorder Test (AUDIT)  1. How often do you have a drink containing alcohol? 0  3. How often do you have six or more drinks on one occasion? 0   A score of 3 or more in women, and 4 or more in men indicates increased risk for alcohol abuse, EXCEPT if all of the points are from question 1   EKG: SB with premature atrial contractions with aberhant conduction. .    Assessment & Plan    Routine Health Maintenance and Physical Exam  Exercise Activities and Dietary recommendations  Goals   None     Immunization History  Administered Date(s) Administered   Zoster, Live 12/19/2013    Health Maintenance  Topic Date Due   Hepatitis C Screening  Never done   Medicare Annual Wellness (AWV)  06/03/2023   COVID-19 Vaccine (1 - 2024-25 season) 12/02/2023 (Originally 06/28/2023)   Zoster Vaccines- Shingrix (1 of 2) 02/14/2024 (Originally 07/03/2000)   DTaP/Tdap/Td (1 - Tdap) 11/15/2024 (Originally 07/03/1969)   Pneumonia Vaccine 32+ Years old (1 of 1 - PCV) 11/15/2024 (Originally 07/04/2015)   Fecal DNA (Cologuard)  01/03/2025   HPV VACCINES  Aged Out   Colonoscopy  Discontinued     Discussed health benefits of physical activity, and encouraged him to engage in regular exercise appropriate for his age and condition.   - CBC - Lipid panel - Comprehensive metabolic panel - PSA - EKG 12-Lead  2. Chronic pain of both knees Recommend [facility] for evaluation.   3. PAC (premature atrial contraction)  - TSH - ECHOCARDIOGRAM COMPLETE; Future        Mila Merry, MD  [facility] [phone] (phone) [phone] (fax)  [facility]

## 2023-11-17 LAB — CBC
Hematocrit: 43.9 % (ref 37.5–51.0)
Hemoglobin: 14.4 g/dL (ref 13.0–17.7)
MCH: 31.7 pg (ref 26.6–33.0)
MCHC: 32.8 g/dL (ref 31.5–35.7)
MCV: 97 fL (ref 79–97)
Platelets: 392 10*3/uL (ref 150–450)
RBC: 4.54 x10E6/uL (ref 4.14–5.80)
RDW: 12.6 % (ref 11.6–15.4)
WBC: 11.1 10*3/uL — ABNORMAL HIGH (ref 3.4–10.8)

## 2023-11-17 LAB — COMPREHENSIVE METABOLIC PANEL
ALT: 12 [IU]/L (ref 0–44)
AST: 15 [IU]/L (ref 0–40)
Albumin: 4.4 g/dL (ref 3.8–4.8)
Alkaline Phosphatase: 86 [IU]/L (ref 44–121)
BUN/Creatinine Ratio: 14 (ref 10–24)
BUN: 12 mg/dL (ref 8–27)
Bilirubin Total: 0.7 mg/dL (ref 0.0–1.2)
CO2: 23 mmol/L (ref 20–29)
Calcium: 9.4 mg/dL (ref 8.6–10.2)
Chloride: 102 mmol/L (ref 96–106)
Creatinine, Ser: 0.85 mg/dL (ref 0.76–1.27)
Globulin, Total: 2.6 g/dL (ref 1.5–4.5)
Glucose: 85 mg/dL (ref 70–99)
Potassium: 5 mmol/L (ref 3.5–5.2)
Sodium: 142 mmol/L (ref 134–144)
Total Protein: 7 g/dL (ref 6.0–8.5)
eGFR: 92 mL/min/{1.73_m2} (ref >59)

## 2023-11-17 LAB — LIPID PANEL
Chol/HDL Ratio: 3 {ratio} (ref 0.0–5.0)
Cholesterol, Total: 178 mg/dL (ref 100–199)
HDL: 60 mg/dL (ref >39)
LDL Chol Calc (NIH): 107 mg/dL — ABNORMAL HIGH (ref 0–99)
Triglycerides: 58 mg/dL (ref 0–149)
VLDL Cholesterol Cal: 11 mg/dL (ref 5–40)

## 2023-11-17 LAB — PSA: Prostate Specific Ag, Serum: 2.5 ng/mL (ref 0.0–4.0)

## 2023-11-17 LAB — TSH: TSH: 4.63 u[IU]/mL — ABNORMAL HIGH (ref 0.450–4.500)

## 2023-11-17 NOTE — Addendum Note (Signed)
Addended by: Malva Limes on: 11/17/2023 09:16 AM   Modules accepted: Orders

## 2023-11-18 ENCOUNTER — Telehealth: Payer: Self-pay

## 2023-11-18 NOTE — Telephone Encounter (Signed)
Copied from CRM [id number]. Topic: General - Other >> Nov 18, 2023  1:01 PM Priscille Loveless wrote: Reason for CRM: Pt needs EKG faxed over to [facility] he has an appt tomorrow.. Fax number [phone] attention Dr Sheppard Coil and Revonda Standard

## 2023-11-19 DIAGNOSIS — R9431 Abnormal electrocardiogram [ECG] [EKG]: Secondary | ICD-10-CM | POA: Diagnosis not present

## 2023-11-19 DIAGNOSIS — I493 Ventricular premature depolarization: Secondary | ICD-10-CM | POA: Diagnosis not present

## 2023-11-19 DIAGNOSIS — I491 Atrial premature depolarization: Secondary | ICD-10-CM | POA: Diagnosis not present

## 2023-11-20 LAB — MAGNESIUM: Magnesium: 2.1 mg/dL (ref 1.6–2.3)

## 2023-11-20 LAB — SPECIMEN STATUS REPORT

## 2023-11-23 DIAGNOSIS — I493 Ventricular premature depolarization: Secondary | ICD-10-CM | POA: Diagnosis not present

## 2023-11-23 DIAGNOSIS — R9431 Abnormal electrocardiogram [ECG] [EKG]: Secondary | ICD-10-CM | POA: Diagnosis not present

## 2023-11-30 ENCOUNTER — Ambulatory Visit: Admission: RE | Admit: 2023-11-30 | Payer: Medicare Other | Source: Ambulatory Visit

## 2023-12-10 DIAGNOSIS — I493 Ventricular premature depolarization: Secondary | ICD-10-CM | POA: Diagnosis not present

## 2023-12-21 DIAGNOSIS — I491 Atrial premature depolarization: Secondary | ICD-10-CM | POA: Diagnosis not present

## 2023-12-21 DIAGNOSIS — I493 Ventricular premature depolarization: Secondary | ICD-10-CM | POA: Diagnosis not present

## 2023-12-21 DIAGNOSIS — I471 Supraventricular tachycardia, unspecified: Secondary | ICD-10-CM | POA: Diagnosis not present

## 2024-01-04 DIAGNOSIS — I493 Ventricular premature depolarization: Secondary | ICD-10-CM | POA: Diagnosis not present

## 2024-01-18 DIAGNOSIS — I491 Atrial premature depolarization: Secondary | ICD-10-CM | POA: Diagnosis not present

## 2024-01-18 DIAGNOSIS — I471 Supraventricular tachycardia, unspecified: Secondary | ICD-10-CM | POA: Diagnosis not present

## 2024-01-18 DIAGNOSIS — I493 Ventricular premature depolarization: Secondary | ICD-10-CM | POA: Diagnosis not present

## 2024-07-18 DIAGNOSIS — I491 Atrial premature depolarization: Secondary | ICD-10-CM | POA: Diagnosis not present

## 2024-07-18 DIAGNOSIS — I471 Supraventricular tachycardia, unspecified: Secondary | ICD-10-CM | POA: Diagnosis not present

## 2024-07-18 DIAGNOSIS — I493 Ventricular premature depolarization: Secondary | ICD-10-CM | POA: Diagnosis not present

## 2024-08-15 DIAGNOSIS — M1711 Unilateral primary osteoarthritis, right knee: Secondary | ICD-10-CM | POA: Diagnosis not present

## 2024-11-18 ENCOUNTER — Encounter: Payer: Self-pay | Admitting: Family Medicine
